# Patient Record
Sex: Female | Born: 1960 | Race: White | Hispanic: No | Marital: Married | Smoking: Former smoker
Health system: Southern US, Community
[De-identification: ages and names within clinical notes are randomized; demographics above are authoritative.]

## PROBLEM LIST (undated history)

## (undated) DIAGNOSIS — E039 Hypothyroidism, unspecified: Secondary | ICD-10-CM

## (undated) DIAGNOSIS — R011 Cardiac murmur, unspecified: Secondary | ICD-10-CM

## (undated) DIAGNOSIS — F419 Anxiety disorder, unspecified: Secondary | ICD-10-CM

## (undated) DIAGNOSIS — M1990 Unspecified osteoarthritis, unspecified site: Secondary | ICD-10-CM

## (undated) DIAGNOSIS — I10 Essential (primary) hypertension: Secondary | ICD-10-CM

## (undated) DIAGNOSIS — F32A Depression, unspecified: Secondary | ICD-10-CM

## (undated) HISTORY — PX: OTHER SURGICAL HISTORY: SHX169

## (undated) HISTORY — PX: ABDOMINAL HYSTERECTOMY: SHX81

## (undated) HISTORY — PX: COLONOSCOPY: SHX174

---

## 2001-05-21 ENCOUNTER — Encounter: Payer: Self-pay | Admitting: Family Medicine

## 2001-05-21 ENCOUNTER — Ambulatory Visit (HOSPITAL_COMMUNITY): Admission: RE | Admit: 2001-05-21 | Discharge: 2001-05-21 | Payer: Self-pay | Admitting: Family Medicine

## 2001-06-08 ENCOUNTER — Other Ambulatory Visit: Admission: RE | Admit: 2001-06-08 | Discharge: 2001-06-08 | Payer: Self-pay | Admitting: Obstetrics & Gynecology

## 2003-08-17 ENCOUNTER — Ambulatory Visit (HOSPITAL_COMMUNITY): Admission: RE | Admit: 2003-08-17 | Discharge: 2003-08-17 | Payer: Self-pay | Admitting: Obstetrics and Gynecology

## 2003-10-06 ENCOUNTER — Ambulatory Visit (HOSPITAL_COMMUNITY): Admission: RE | Admit: 2003-10-06 | Discharge: 2003-10-06 | Payer: Self-pay | Admitting: Internal Medicine

## 2003-12-07 ENCOUNTER — Ambulatory Visit (HOSPITAL_COMMUNITY): Admission: RE | Admit: 2003-12-07 | Discharge: 2003-12-07 | Payer: Self-pay | Admitting: Internal Medicine

## 2004-02-20 ENCOUNTER — Ambulatory Visit (HOSPITAL_COMMUNITY): Admission: RE | Admit: 2004-02-20 | Discharge: 2004-02-20 | Payer: Self-pay | Admitting: Internal Medicine

## 2005-01-23 ENCOUNTER — Ambulatory Visit (HOSPITAL_COMMUNITY): Admission: RE | Admit: 2005-01-23 | Discharge: 2005-01-23 | Payer: Self-pay | Admitting: General Surgery

## 2005-03-08 ENCOUNTER — Ambulatory Visit (HOSPITAL_COMMUNITY): Admission: RE | Admit: 2005-03-08 | Discharge: 2005-03-08 | Payer: Self-pay | Admitting: Internal Medicine

## 2006-01-03 ENCOUNTER — Ambulatory Visit (HOSPITAL_COMMUNITY): Admission: RE | Admit: 2006-01-03 | Discharge: 2006-01-03 | Payer: Self-pay | Admitting: Otolaryngology

## 2006-03-17 ENCOUNTER — Ambulatory Visit (HOSPITAL_COMMUNITY): Admission: RE | Admit: 2006-03-17 | Discharge: 2006-03-17 | Payer: Self-pay | Admitting: Internal Medicine

## 2006-04-04 ENCOUNTER — Encounter: Admission: RE | Admit: 2006-04-04 | Discharge: 2006-04-04 | Payer: Self-pay | Admitting: Internal Medicine

## 2007-03-19 ENCOUNTER — Encounter: Admission: RE | Admit: 2007-03-19 | Discharge: 2007-03-19 | Payer: Self-pay | Admitting: Family Medicine

## 2007-04-03 ENCOUNTER — Encounter: Admission: RE | Admit: 2007-04-03 | Discharge: 2007-04-03 | Payer: Self-pay | Admitting: Family Medicine

## 2008-01-29 HISTORY — PX: BREAST BIOPSY: SHX20

## 2008-04-04 ENCOUNTER — Encounter: Admission: RE | Admit: 2008-04-04 | Discharge: 2008-04-04 | Payer: Self-pay | Admitting: General Practice

## 2009-04-04 ENCOUNTER — Ambulatory Visit (HOSPITAL_COMMUNITY): Admission: RE | Admit: 2009-04-04 | Discharge: 2009-04-04 | Payer: Self-pay | Admitting: General Surgery

## 2009-04-17 ENCOUNTER — Encounter: Admission: RE | Admit: 2009-04-17 | Discharge: 2009-04-17 | Payer: Self-pay | Admitting: General Practice

## 2009-04-20 ENCOUNTER — Encounter: Admission: RE | Admit: 2009-04-20 | Discharge: 2009-04-20 | Payer: Self-pay | Admitting: General Practice

## 2010-02-18 ENCOUNTER — Encounter: Payer: Self-pay | Admitting: Internal Medicine

## 2010-02-18 ENCOUNTER — Encounter: Payer: Self-pay | Admitting: General Practice

## 2010-04-02 ENCOUNTER — Other Ambulatory Visit: Payer: Self-pay | Admitting: General Practice

## 2010-04-02 DIAGNOSIS — Z1231 Encounter for screening mammogram for malignant neoplasm of breast: Secondary | ICD-10-CM

## 2010-04-19 ENCOUNTER — Ambulatory Visit
Admission: RE | Admit: 2010-04-19 | Discharge: 2010-04-19 | Disposition: A | Discharge status: Home / Self Care | Payer: Self-pay | Source: Ambulatory Visit | Attending: General Practice | Admitting: General Practice

## 2010-04-19 DIAGNOSIS — Z1231 Encounter for screening mammogram for malignant neoplasm of breast: Secondary | ICD-10-CM

## 2010-06-15 NOTE — H&P (Signed)
NAME:  Carla Ali, Carla Ali             ACCOUNT NO.:  [id number]   MEDICAL RECORD NO.:  [id number]          PATIENT TYPE:  AMB   LOCATION:  DAY                           FACILITY:  [facility]   PHYSICIAN:  Dalia Heading, M.D.  DATE OF BIRTH:  05/13/1960   DATE OF ADMISSION:  DATE OF DISCHARGE:  LH                                HISTORY & PHYSICAL   CHIEF COMPLAINT:  Family history of colon carcinoma, GERD.   HISTORY OF PRESENT ILLNESS:  The patient is a 50 year old white female who  is referred for endoscopic evaluation. She needs a colonoscopy for family  history of colon carcinoma and EGD for GERD. Her mother was recently  diagnosed with colon cancer and another relative has multiple colonic  polyps. She was also recently treated for a Helicobacter pylori infection.  She has a sore throat and chronic cough with some laryngitis present. She  does feel an acid taste in her mouth. No weight loss, nausea, vomiting,  diarrhea, constipation, melena, or hematochezia have been noted. She has  never had a colonoscopy.   PAST MEDICAL HISTORY:  As noted above.   PAST SURGICAL HISTORY:  Unremarkable.   CURRENT MEDICATIONS:  Prevacid, Percocet.   ALLERGIES:  No known drug allergies.   REVIEW OF SYSTEMS:  Noncontributory.   PHYSICAL EXAMINATION:  GENERAL:  The patient is a well-developed, well-  nourished, white female in no acute distress. She is afebrile and vital  signs are stable.  LUNGS:  Clear to auscultation with equal breath sounds bilaterally.  HEART:  Reveals a regular rate and rhythm without S3, S4, or murmurs.  ABDOMEN:  Soft, nontender, nondistended. No hepatosplenomegaly or masses are  noted.  RECTAL:  Deferred to the procedure.   IMPRESSION:  Family history of colon carcinoma, gastroesophageal reflux  disease.   PLAN:  The patient is scheduled for an EGD and colonoscopy on January 23, 2005. The risks and benefits of the procedure including bleeding and  perforation were  fully explained to the patient who gave informed consent.      Dalia Heading, M.D.  Electronically Signed     MAJ/MEDQ  D:  01/10/2005  T:  01/10/2005  Job:  [id number]   cc:   [facility]  Fax: [phone]   Madelin Rear. Sherwood Gambler, MD  Fax: [phone]

## 2011-04-09 ENCOUNTER — Other Ambulatory Visit: Payer: Self-pay | Admitting: Family Medicine

## 2011-04-09 ENCOUNTER — Other Ambulatory Visit: Payer: Self-pay | Admitting: General Practice

## 2011-04-09 DIAGNOSIS — Z1231 Encounter for screening mammogram for malignant neoplasm of breast: Secondary | ICD-10-CM

## 2011-04-24 ENCOUNTER — Ambulatory Visit
Admission: RE | Admit: 2011-04-24 | Discharge: 2011-04-24 | Disposition: A | Discharge status: Home / Self Care | Payer: 59 | Source: Ambulatory Visit | Attending: General Practice | Admitting: General Practice

## 2011-04-24 DIAGNOSIS — Z1231 Encounter for screening mammogram for malignant neoplasm of breast: Secondary | ICD-10-CM

## 2012-04-21 ENCOUNTER — Other Ambulatory Visit: Payer: Self-pay

## 2012-04-21 DIAGNOSIS — Z1231 Encounter for screening mammogram for malignant neoplasm of breast: Secondary | ICD-10-CM

## 2012-05-12 ENCOUNTER — Ambulatory Visit
Admission: RE | Admit: 2012-05-12 | Discharge: 2012-05-12 | Disposition: A | Discharge status: Home / Self Care | Payer: 59 | Source: Ambulatory Visit

## 2012-05-12 DIAGNOSIS — Z1231 Encounter for screening mammogram for malignant neoplasm of breast: Secondary | ICD-10-CM

## 2013-05-14 ENCOUNTER — Other Ambulatory Visit: Payer: Self-pay

## 2013-05-14 DIAGNOSIS — Z1231 Encounter for screening mammogram for malignant neoplasm of breast: Secondary | ICD-10-CM

## 2013-05-24 ENCOUNTER — Encounter (INDEPENDENT_AMBULATORY_CARE_PROVIDER_SITE_OTHER): Payer: Self-pay

## 2013-05-24 ENCOUNTER — Ambulatory Visit
Admission: RE | Admit: 2013-05-24 | Discharge: 2013-05-24 | Disposition: A | Discharge status: Home / Self Care | Payer: Self-pay | Source: Ambulatory Visit

## 2013-05-24 ENCOUNTER — Ambulatory Visit: Payer: 59

## 2013-05-24 DIAGNOSIS — Z1231 Encounter for screening mammogram for malignant neoplasm of breast: Secondary | ICD-10-CM

## 2014-06-10 ENCOUNTER — Other Ambulatory Visit: Payer: Self-pay

## 2014-06-10 DIAGNOSIS — Z1231 Encounter for screening mammogram for malignant neoplasm of breast: Secondary | ICD-10-CM

## 2014-06-17 ENCOUNTER — Ambulatory Visit: Payer: Self-pay

## 2014-07-21 ENCOUNTER — Ambulatory Visit: Payer: Self-pay

## 2014-07-25 ENCOUNTER — Ambulatory Visit
Admission: RE | Admit: 2014-07-25 | Discharge: 2014-07-25 | Disposition: A | Discharge status: Home / Self Care | Payer: 59 | Source: Ambulatory Visit

## 2014-07-25 DIAGNOSIS — Z1231 Encounter for screening mammogram for malignant neoplasm of breast: Secondary | ICD-10-CM

## 2015-06-20 ENCOUNTER — Other Ambulatory Visit: Payer: Self-pay

## 2015-06-20 DIAGNOSIS — Z1231 Encounter for screening mammogram for malignant neoplasm of breast: Secondary | ICD-10-CM

## 2015-07-27 ENCOUNTER — Ambulatory Visit
Admission: RE | Admit: 2015-07-27 | Discharge: 2015-07-27 | Disposition: A | Discharge status: Home / Self Care | Payer: 59 | Source: Ambulatory Visit

## 2015-07-27 DIAGNOSIS — Z1231 Encounter for screening mammogram for malignant neoplasm of breast: Secondary | ICD-10-CM

## 2016-09-05 ENCOUNTER — Other Ambulatory Visit: Payer: Self-pay | Admitting: Nurse Practitioner

## 2016-09-05 DIAGNOSIS — Z1231 Encounter for screening mammogram for malignant neoplasm of breast: Secondary | ICD-10-CM

## 2016-09-17 ENCOUNTER — Ambulatory Visit
Admission: RE | Admit: 2016-09-17 | Discharge: 2016-09-17 | Disposition: A | Discharge status: Home / Self Care | Payer: 59 | Source: Ambulatory Visit | Attending: Nurse Practitioner | Admitting: Nurse Practitioner

## 2016-09-17 DIAGNOSIS — Z1231 Encounter for screening mammogram for malignant neoplasm of breast: Secondary | ICD-10-CM

## 2017-10-02 ENCOUNTER — Other Ambulatory Visit: Payer: Self-pay | Admitting: Family Medicine

## 2017-10-02 DIAGNOSIS — Z1231 Encounter for screening mammogram for malignant neoplasm of breast: Secondary | ICD-10-CM

## 2017-10-23 ENCOUNTER — Encounter: Payer: Self-pay | Admitting: General Surgery

## 2017-10-23 ENCOUNTER — Ambulatory Visit (INDEPENDENT_AMBULATORY_CARE_PROVIDER_SITE_OTHER): Payer: 59 | Admitting: General Surgery

## 2017-10-23 ENCOUNTER — Ambulatory Visit
Admission: RE | Admit: 2017-10-23 | Discharge: 2017-10-23 | Disposition: A | Discharge status: Home / Self Care | Payer: 59 | Source: Ambulatory Visit | Attending: Family Medicine | Admitting: Family Medicine

## 2017-10-23 VITALS — BP 159/84 | HR 61 | Temp 98.8°F | Ht 64.0 in | Wt 186.0 lb

## 2017-10-23 DIAGNOSIS — Z1211 Encounter for screening for malignant neoplasm of colon: Secondary | ICD-10-CM | POA: Diagnosis not present

## 2017-10-23 DIAGNOSIS — Z1231 Encounter for screening mammogram for malignant neoplasm of breast: Secondary | ICD-10-CM

## 2017-10-23 NOTE — Progress Notes (Signed)
Carla Ali; [id number]; 01/20/1961   HPI Patient is a 57-year-old white female who was referred to my care by Michael Waters for a screening colonoscopy.  Patient last had a colonoscopy 8 years ago.  She denies any nausea, vomiting, abdominal pain, blood per rectum, abnormal diarrhea or constipation.  She does have an immediate family member who had colon cancer (mother).  Patient has 0 out of 10 abdominal pain. History reviewed. No pertinent past medical history.  Past Surgical History:  Procedure Laterality Date  . BREAST BIOPSY Right 2010   benign    Family History  Problem Relation Age of Onset  . Breast cancer Paternal Aunt     No current outpatient medications on file prior to visit.   No current facility-administered medications on file prior to visit.     Not on File  Social History   Substance and Sexual Activity  Alcohol Use Not on file    Social History   Tobacco Use  Smoking Status Never Smoker  Smokeless Tobacco Never Used    Review of Systems  Constitutional: Negative.   HENT: Negative.   Eyes: Negative.   Respiratory: Negative.   Cardiovascular: Negative.   Gastrointestinal: Negative.   Genitourinary: Negative.   Musculoskeletal: Positive for back pain, joint pain and neck pain.  Skin: Negative.   Neurological: Negative.   Endo/Heme/Allergies: Negative.   Psychiatric/Behavioral: Negative.     Objective   Vitals:   10/23/17 0917  BP: (!) 159/84  Pulse: 61  Temp: 98.8 F (37.1 C)    Physical Exam  Constitutional: She is oriented to person, place, and time. She appears well-developed and well-nourished. No distress.  HENT:  Head: Normocephalic and atraumatic.  Cardiovascular: Normal rate, regular rhythm and normal heart sounds. Exam reveals no gallop and no friction rub.  No murmur heard. Pulmonary/Chest: Effort normal and breath sounds normal. No stridor. No respiratory distress. She has no wheezes. She has no rales.  Abdominal:  Soft. Bowel sounds are normal. She exhibits no distension and no mass. There is no tenderness. There is no rebound and no guarding.  Neurological: She is alert and oriented to person, place, and time.  Skin: Skin is warm and dry.  Vitals reviewed.   Assessment  Need for screening colonoscopy, family history of colon cancer Plan   Patient is scheduled for screening colonoscopy on 11/18/2017.  The risks and benefits of the procedure including bleeding and perforation were fully explained to the patient, who gave informed consent.  Golytely prescribed.  

## 2017-10-23 NOTE — H&P (Signed)
Carla Ali; [id number]; 02-Sep-1960   HPI Patient is a 57 year old white female who was referred to my care by Adonis Huguenin for a screening colonoscopy.  Patient last had a colonoscopy 8 years ago.  She denies any nausea, vomiting, abdominal pain, blood per rectum, abnormal diarrhea or constipation.  She does have an immediate family member who had colon cancer (mother).  Patient has 0 out of 10 abdominal pain. History reviewed. No pertinent past medical history.  Past Surgical History:  Procedure Laterality Date  . BREAST BIOPSY Right 2010   benign    Family History  Problem Relation Age of Onset  . Breast cancer Paternal Aunt     No current outpatient medications on file prior to visit.   No current facility-administered medications on file prior to visit.     Not on File  Social History   Substance and Sexual Activity  Alcohol Use Not on file    Social History   Tobacco Use  Smoking Status Never Smoker  Smokeless Tobacco Never Used    Review of Systems  Constitutional: Negative.   HENT: Negative.   Eyes: Negative.   Respiratory: Negative.   Cardiovascular: Negative.   Gastrointestinal: Negative.   Genitourinary: Negative.   Musculoskeletal: Positive for back pain, joint pain and neck pain.  Skin: Negative.   Neurological: Negative.   Endo/Heme/Allergies: Negative.   Psychiatric/Behavioral: Negative.     Objective   Vitals:   10/23/17 0917  BP: (!) 159/84  Pulse: 61  Temp: 98.8 F (37.1 C)    Physical Exam  Constitutional: She is oriented to person, place, and time. She appears well-developed and well-nourished. No distress.  HENT:  Head: Normocephalic and atraumatic.  Cardiovascular: Normal rate, regular rhythm and normal heart sounds. Exam reveals no gallop and no friction rub.  No murmur heard. Pulmonary/Chest: Effort normal and breath sounds normal. No stridor. No respiratory distress. She has no wheezes. She has no rales.  Abdominal:  Soft. Bowel sounds are normal. She exhibits no distension and no mass. There is no tenderness. There is no rebound and no guarding.  Neurological: She is alert and oriented to person, place, and time.  Skin: Skin is warm and dry.  Vitals reviewed.   Assessment  Need for screening colonoscopy, family history of colon cancer Plan   Patient is scheduled for screening colonoscopy on 11/18/2017.  The risks and benefits of the procedure including bleeding and perforation were fully explained to the patient, who gave informed consent.  Golytely prescribed.

## 2017-10-23 NOTE — Patient Instructions (Signed)
Colonoscopy, Adult A colonoscopy is an exam to look at the entire large intestine. During the exam, a lubricated, bendable tube is inserted into the anus and then passed into the rectum, colon, and other parts of the large intestine. A colonoscopy is often done as a part of normal colorectal screening or in response to certain symptoms, such as anemia, persistent diarrhea, abdominal pain, and blood in the stool. The exam can help screen for and diagnose medical problems, including:  Tumors.  Polyps.  Inflammation.  Areas of bleeding.  Tell a health care provider about:  Any allergies you have.  All medicines you are taking, including vitamins, herbs, eye drops, creams, and over-the-counter medicines.  Any problems you or family members have had with anesthetic medicines.  Any blood disorders you have.  Any surgeries you have had.  Any medical conditions you have.  Any problems you have had passing stool. What are the risks? Generally, this is a safe procedure. However, problems may occur, including:  Bleeding.  A tear in the intestine.  A reaction to medicines given during the exam.  Infection (rare).  What happens before the procedure? Eating and drinking restrictions Follow instructions from your health care provider about eating and drinking, which may include:  A few days before the procedure - follow a low-fiber diet. Avoid nuts, seeds, dried fruit, raw fruits, and vegetables.  1-3 days before the procedure - follow a clear liquid diet. Drink only clear liquids, such as clear broth or bouillon, black coffee or tea, clear juice, clear soft drinks or sports drinks, gelatin dessert, and popsicles. Avoid any liquids that contain red or purple dye.  On the day of the procedure - do not eat or drink anything during the 2 hours before the procedure, or within the time period that your health care provider recommends.  Bowel prep If you were prescribed an oral bowel prep  to clean out your colon:  Take it as told by your health care provider. Starting the day before your procedure, you will need to drink a large amount of medicated liquid. The liquid will cause you to have multiple loose stools until your stool is almost clear or light green.  If your skin or anus gets irritated from diarrhea, you may use these to relieve the irritation: ? Medicated wipes, such as adult wet wipes with aloe and vitamin E. ? A skin soothing-product like petroleum jelly.  If you vomit while drinking the bowel prep, take a break for up to 60 minutes and then begin the bowel prep again. If vomiting continues and you cannot take the bowel prep without vomiting, call your health care provider.  General instructions  Ask your health care provider about changing or stopping your regular medicines. This is especially important if you are taking diabetes medicines or blood thinners.  Plan to have someone take you home from the hospital or clinic. What happens during the procedure?  An IV tube may be inserted into one of your veins.  You will be given medicine to help you relax (sedative).  To reduce your risk of infection: ? Your health care team will wash or sanitize their hands. ? Your anal area will be washed with soap.  You will be asked to lie on your side with your knees bent.  Your health care provider will lubricate a long, thin, flexible tube. The tube will have a camera and a light on the end.  The tube will be inserted into your   anus.  The tube will be gently eased through your rectum and colon.  Air will be delivered into your colon to keep it open. You may feel some pressure or cramping.  The camera will be used to take images during the procedure.  A small tissue sample may be removed from your body to be examined under a microscope (biopsy). If any potential problems are found, the tissue will be sent to a lab for testing.  If small polyps are found, your  health care provider may remove them and have them checked for cancer cells.  The tube that was inserted into your anus will be slowly removed. The procedure may vary among health care providers and hospitals. What happens after the procedure?  Your blood pressure, heart rate, breathing rate, and blood oxygen level will be monitored until the medicines you were given have worn off.  Do not drive for 24 hours after the exam.  You may have a small amount of blood in your stool.  You may pass gas and have mild abdominal cramping or bloating due to the air that was used to inflate your colon during the exam.  It is up to you to get the results of your procedure. Ask your health care provider, or the department performing the procedure, when your results will be ready. This information is not intended to replace advice given to you by your health care provider. Make sure you discuss any questions you have with your health care provider. Document Released: 01/12/2000 Document Revised: 11/15/2015 Document Reviewed: 03/28/2015 Elsevier Interactive Patient Education  2018 Elsevier Inc.  

## 2017-11-03 ENCOUNTER — Ambulatory Visit: Payer: 59

## 2017-11-18 ENCOUNTER — Ambulatory Visit (HOSPITAL_COMMUNITY)
Admission: RE | Admit: 2017-11-18 | Discharge: 2017-11-18 | Disposition: A | Discharge status: Home / Self Care | Payer: 59 | Source: Ambulatory Visit | Attending: General Surgery | Admitting: General Surgery

## 2017-11-18 ENCOUNTER — Encounter (HOSPITAL_COMMUNITY): Payer: Self-pay | Admitting: *Deleted

## 2017-11-18 ENCOUNTER — Encounter (HOSPITAL_COMMUNITY)
Admission: RE | Disposition: A | Discharge status: Home / Self Care | Payer: Self-pay | Source: Ambulatory Visit | Attending: General Surgery

## 2017-11-18 ENCOUNTER — Other Ambulatory Visit: Payer: Self-pay

## 2017-11-18 DIAGNOSIS — Z800 Family history of malignant neoplasm of digestive organs: Secondary | ICD-10-CM | POA: Diagnosis not present

## 2017-11-18 DIAGNOSIS — Z1211 Encounter for screening for malignant neoplasm of colon: Secondary | ICD-10-CM | POA: Diagnosis present

## 2017-11-18 HISTORY — DX: Essential (primary) hypertension: I10

## 2017-11-18 HISTORY — DX: Hypothyroidism, unspecified: E03.9

## 2017-11-18 HISTORY — PX: COLONOSCOPY: SHX5424

## 2017-11-18 SURGERY — COLONOSCOPY
Anesthesia: Moderate Sedation

## 2017-11-18 MED ORDER — MIDAZOLAM HCL 5 MG/5ML IJ SOLN
INTRAMUSCULAR | Status: AC
  Filled 2017-11-18: qty 5

## 2017-11-18 MED ORDER — MEPERIDINE HCL 100 MG/ML IJ SOLN
INTRAMUSCULAR | Status: AC
  Filled 2017-11-18: qty 1

## 2017-11-18 MED ORDER — SODIUM CHLORIDE 0.9 % IV SOLN
INTRAVENOUS | Status: DC
  Administered 2017-11-18: 07:00:00 via INTRAVENOUS

## 2017-11-18 MED ORDER — MIDAZOLAM HCL 5 MG/5ML IJ SOLN
INTRAMUSCULAR | Status: DC | PRN
  Administered 2017-11-18: 1 mg via INTRAVENOUS
  Administered 2017-11-18: 3 mg via INTRAVENOUS

## 2017-11-18 MED ORDER — MEPERIDINE HCL 50 MG/ML IJ SOLN
INTRAMUSCULAR | Status: DC | PRN
  Administered 2017-11-18: 50 mg
  Administered 2017-11-18: 25 mg

## 2017-11-18 NOTE — Interval H&P Note (Signed)
History and Physical Interval Note:  11/18/2017 7:26 AM  Carla Ali  has presented today for surgery, with the diagnosis of screening  The various methods of treatment have been discussed with the patient and family. After consideration of risks, benefits and other options for treatment, the patient has consented to  Procedure(s): COLONOSCOPY (N/A) as a surgical intervention .  The patient's history has been reviewed, patient examined, no change in status, stable for surgery.  I have reviewed the patient's chart and labs.  Questions were answered to the patient's satisfaction.     Franky Macho

## 2017-11-18 NOTE — Discharge Instructions (Signed)
Colonoscopy, Adult, Care After  This sheet gives you information about how to care for yourself after your procedure. Your health care provider may also give you more specific instructions. If you have problems or questions, contact your health care provider.  What can I expect after the procedure?  After the procedure, it is common to have:  · A small amount of blood in your stool for 24 hours after the procedure.  · Some gas.  · Mild abdominal cramping or bloating.    Follow these instructions at home:  General instructions    · For the first 24 hours after the procedure:  ? Do not drive or use machinery.  ? Do not sign important documents.  ? Do not drink alcohol.  ? Do your regular daily activities at a slower pace than normal.  ? Eat soft, easy-to-digest foods.  ? Rest often.  · Take over-the-counter or prescription medicines only as told by your health care provider.  · It is up to you to get the results of your procedure. Ask your health care provider, or the department performing the procedure, when your results will be ready.  Relieving cramping and bloating  · Try walking around when you have cramps or feel bloated.  · Apply heat to your abdomen as told by your health care provider. Use a heat source that your health care provider recommends, such as a moist heat pack or a heating pad.  ? Place a towel between your skin and the heat source.  ? Leave the heat on for 20-30 minutes.  ? Remove the heat if your skin turns bright red. This is especially important if you are unable to feel pain, heat, or cold. You may have a greater risk of getting burned.  Eating and drinking  · Drink enough fluid to keep your urine clear or pale yellow.  · Resume your normal diet as instructed by your health care provider. Avoid heavy or fried foods that are hard to digest.  · Avoid drinking alcohol for as long as instructed by your health care provider.  Contact a health care provider if:  · You have blood in your stool 2-3  days after the procedure.  Get help right away if:  · You have more than a small spotting of blood in your stool.  · You pass large blood clots in your stool.  · Your abdomen is swollen.  · You have nausea or vomiting.  · You have a fever.  · You have increasing abdominal pain that is not relieved with medicine.  This information is not intended to replace advice given to you by your health care provider. Make sure you discuss any questions you have with your health care provider.  Document Released: 08/29/2003 Document Revised: 10/09/2015 Document Reviewed: 03/28/2015  Elsevier Interactive Patient Education © 2018 Elsevier Inc.

## 2017-11-18 NOTE — Op Note (Signed)
[facility] Patient Name: Carla Ali Procedure Date: 11/18/2017 7:04 AM MRN: [id number] Date of Birth: 1960-07-22 Attending MD: Franky Macho , MD CSN: [id number] Age: 57 Admit Type: Outpatient Procedure:                Colonoscopy Indications:              Screening in patient at increased risk: Family                            history of 1st-degree relative with colorectal                            cancer Providers:                Franky Macho, MD, Jannett Celestine, RN, Dyann Ruddle Referring MD:              Medicines:                Midazolam 4 mg IV, Meperidine 75 mg IV Complications:            No immediate complications. Estimated blood loss:                            None. Estimated Blood Loss:     Estimated blood loss: none. Procedure:                Pre-Anesthesia Assessment:                           - Prior to the procedure, a History and Physical                            was performed, and patient medications and                            allergies were reviewed. The patient is competent.                            The risks and benefits of the procedure and the                            sedation options and risks were discussed with the                            patient. All questions were answered and informed                            consent was obtained. Patient identification and                            proposed procedure were verified by the physician,                            the nurse and the technician in the endoscopy  suite. Mental Status Examination: alert and                            oriented. Airway Examination: normal oropharyngeal                            airway and neck mobility. Respiratory Examination:                            clear to auscultation. CV Examination: normal.                            Prophylactic Antibiotics: The patient does not                            require prophylactic  antibiotics. Prior                            Anticoagulants: The patient has taken no previous                            anticoagulant or antiplatelet agents. ASA Grade                            Assessment: II - A patient with mild systemic                            disease. After reviewing the risks and benefits,                            the patient was deemed in satisfactory condition to                            undergo the procedure. The anesthesia plan was to                            use moderate sedation / analgesia (conscious                            sedation). Immediately prior to administration of                            medications, the patient was re-assessed for                            adequacy to receive sedatives. The heart rate,                            respiratory rate, oxygen saturations, blood                            pressure, adequacy of pulmonary ventilation, and  response to care were monitored throughout the                            procedure. The physical status of the patient was                            re-assessed after the procedure.                           After obtaining informed consent, the colonoscope                            was passed under direct vision. Throughout the                            procedure, the patient's blood pressure, pulse, and                            oxygen saturations were monitored continuously. The                            [id number]-[id number] ([id number]) scope was introduced through                            the anus and advanced to the the cecum, identified                            by the appendiceal orifice, ileocecal valve and                            palpation. The colonoscopy was performed without                            difficulty. No anatomical landmarks were                            photographed. The entire colon was well visualized.                            The  quality of the bowel preparation was adequate.                            The total duration of the procedure was 20 minutes. Scope In: 7:30:35 AM Scope Out: 7:48:53 AM Scope Withdrawal Time: 0 hours 3 minutes 45 seconds  Total Procedure Duration: 0 hours 18 minutes 18 seconds  Findings:      The perianal and digital rectal examinations were normal.      The entire examined colon appeared normal on direct and retroflexion       views. Impression:               - The entire examined colon is normal on direct and                            retroflexion views.                           -  No specimens collected. Moderate Sedation:      Moderate (conscious) sedation was administered by the endoscopy nurse       and supervised by the endoscopist. The following parameters were       monitored: oxygen saturation, heart rate, blood pressure, and response       to care. Recommendation:           - Written discharge instructions were provided to                            the patient.                           - The signs and symptoms of potential delayed                            complications were discussed with the patient.                           - Patient has a contact number available for                            emergencies.                           - Return to normal activities tomorrow.                           - Resume previous diet.                           - Continue present medications.                           - Repeat colonoscopy in 5 years for screening                            purposes. Procedure Code(s):        --- Professional ---                           [phone], Colonoscopy, flexible; diagnostic, including                            collection of specimen(s) by brushing or washing,                            when performed (separate procedure) Diagnosis Code(s):        --- Professional ---                           Z80.0, Family history of malignant neoplasm of                             digestive organs CPT copyright 2018 American [employer] Association. All rights reserved. The codes documented in this report are preliminary and upon coder review may  be revised to meet current compliance requirements.  Franky Macho, MD Franky Macho, MD 11/18/2017 7:54:16 AM This report has been signed electronically. Number of Addenda: 0

## 2017-11-24 ENCOUNTER — Encounter (HOSPITAL_COMMUNITY): Payer: Self-pay | Admitting: General Surgery

## 2019-10-23 IMAGING — MG DIGITAL SCREENING BILATERAL MAMMOGRAM WITH TOMO AND CAD
8 series · 8 of 24 positions shown · non-contrast
Comparison: Previous exam(s).

CLINICAL DATA: Screening.

EXAM:
DIGITAL SCREENING BILATERAL MAMMOGRAM WITH TOMO AND CAD

[L CC synth-2D]
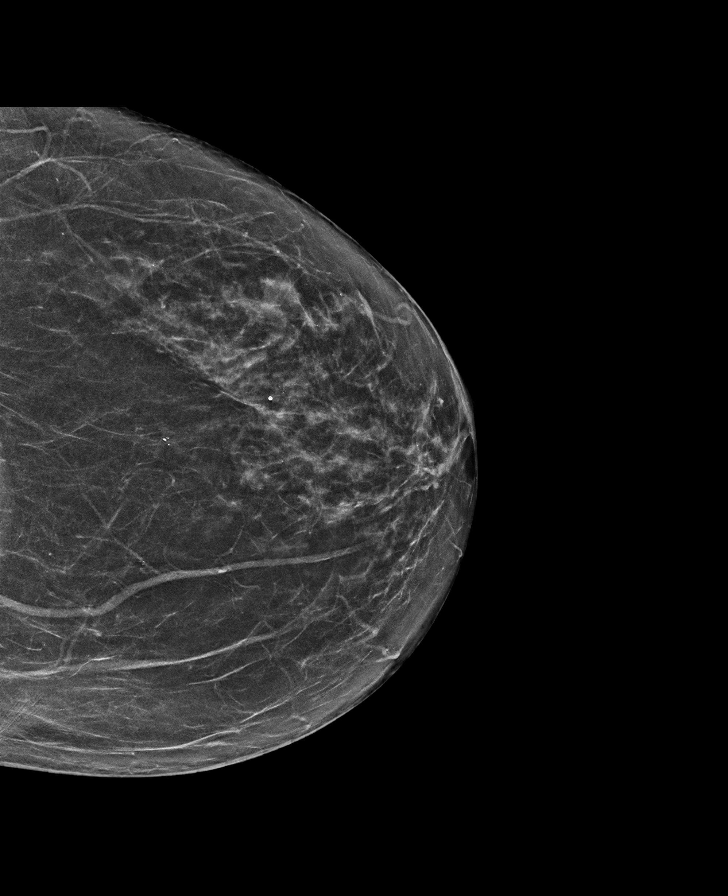

[R MLO synth-2D]
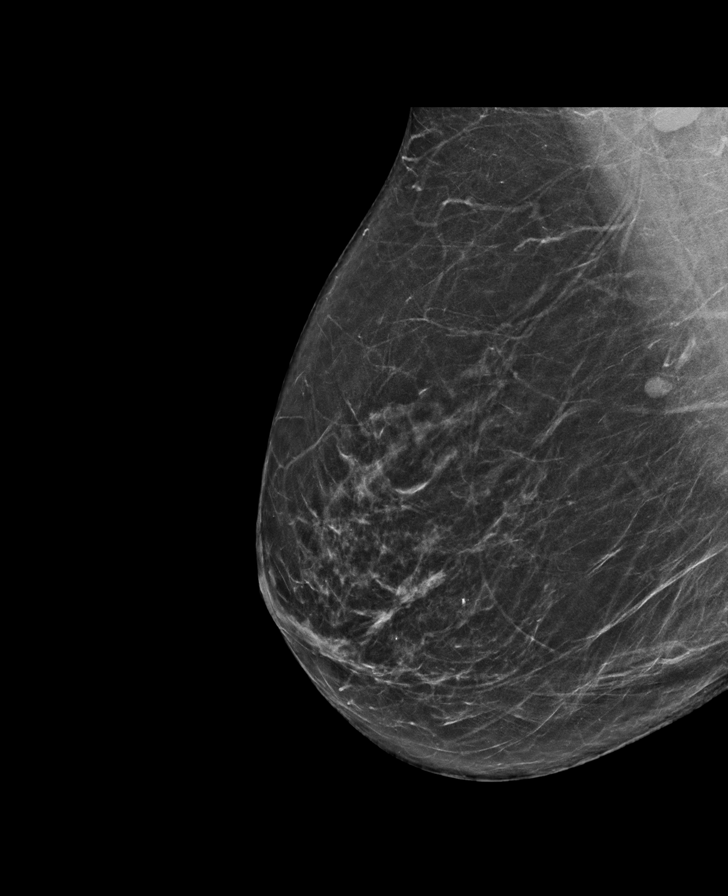

[L MLO synth-2D]
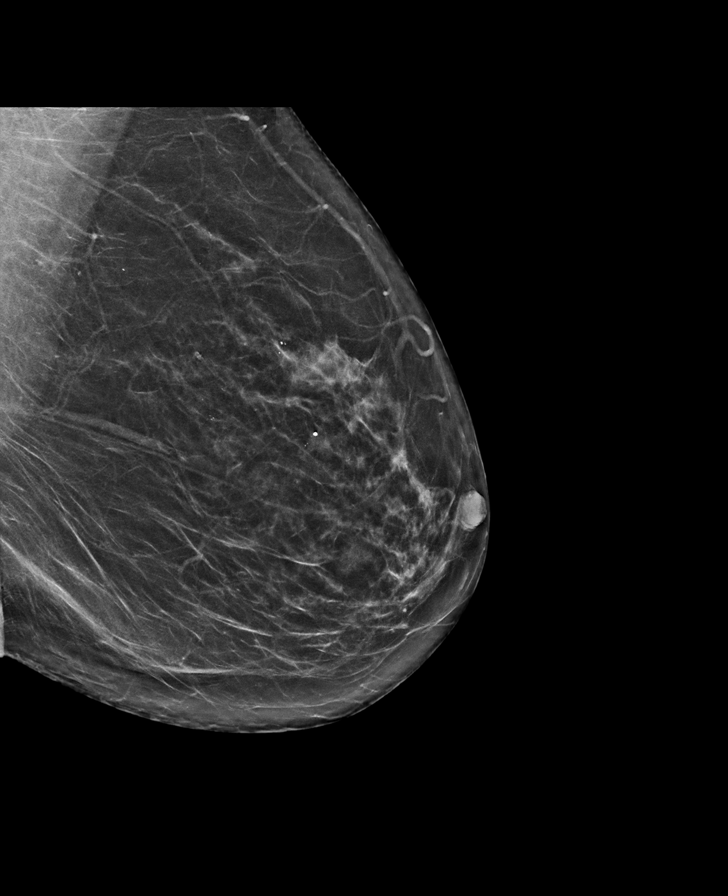

[R CC synth-2D]
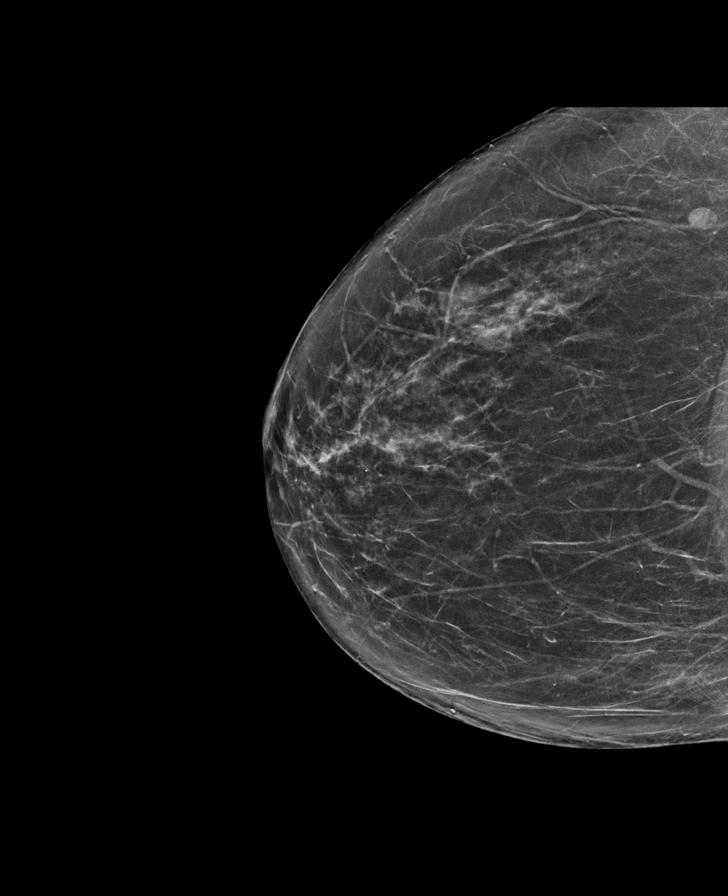

[R MLO tomo · tomo slice 35/69.0]
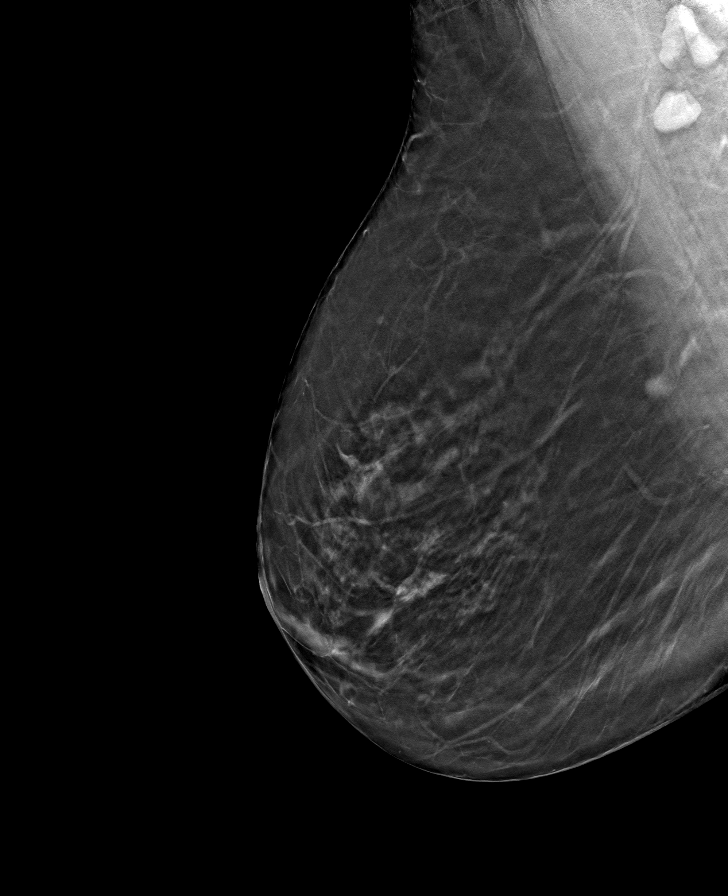

[L CC tomo · tomo slice 33/64.0]
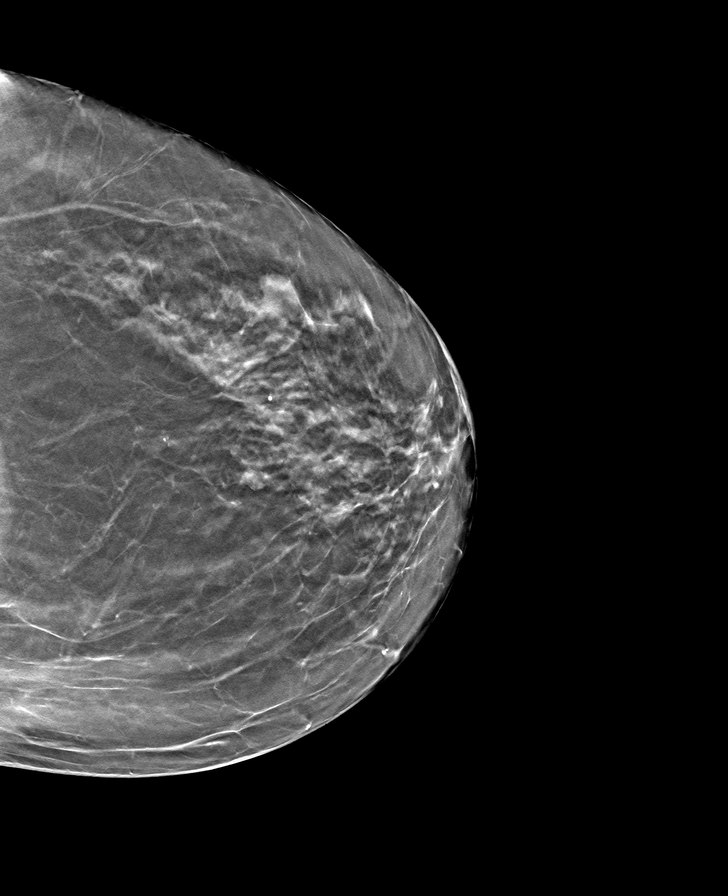

[L MLO tomo · tomo slice 37/72.0]
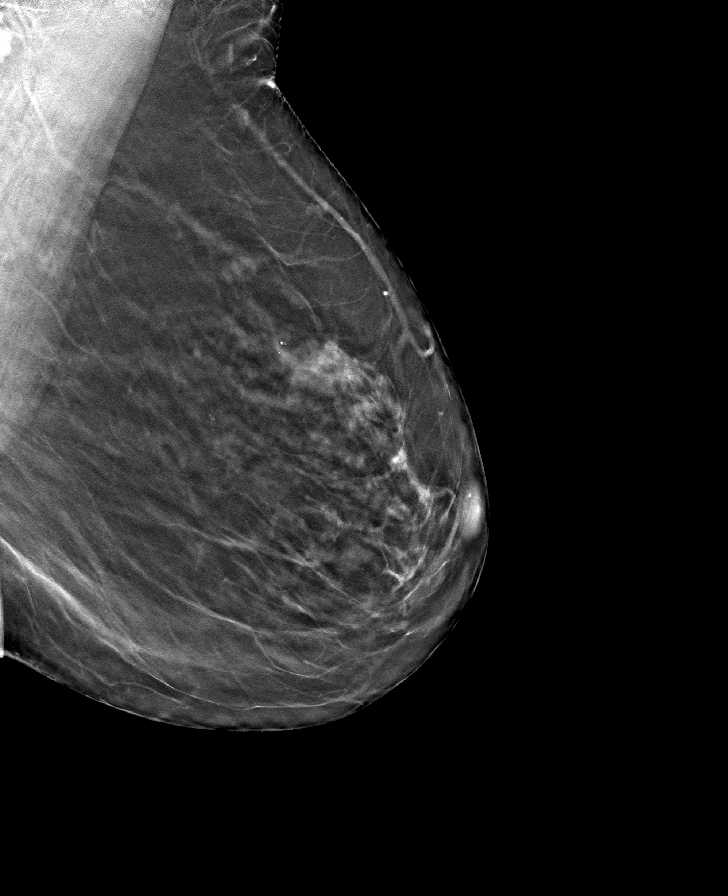

[R CC tomo · tomo slice 34/67.0]
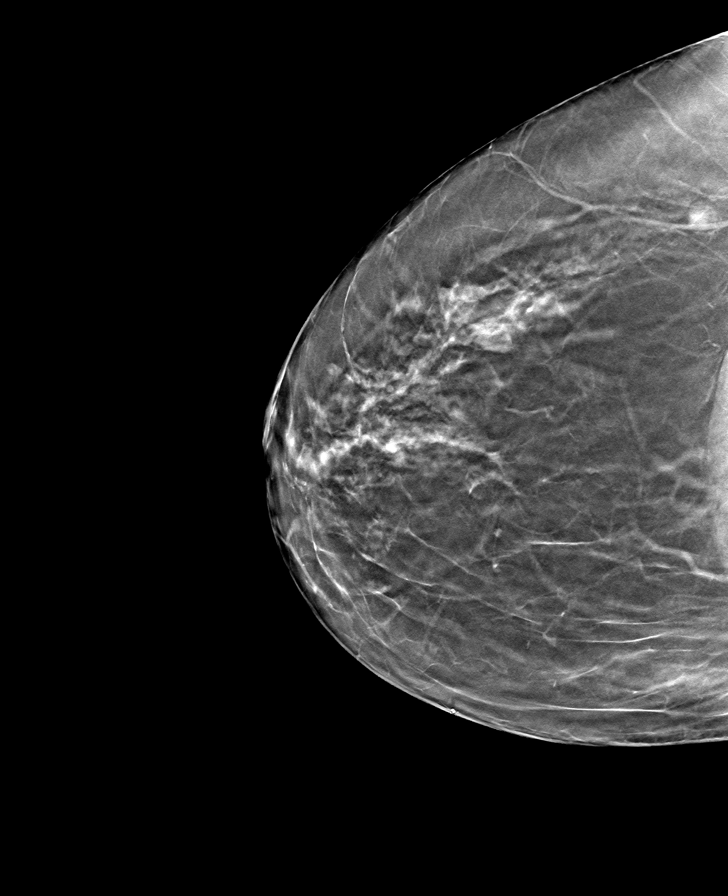

[8 of 24 positions shown; findings below may reference images not displayed]

ACR Breast Density Category b: There are scattered areas of
fibroglandular density.
FINDINGS: There are no findings suspicious for malignancy. Images were
processed with CAD.
IMPRESSION: No mammographic evidence of malignancy. A result letter of this
screening mammogram will be mailed directly to the patient.

RECOMMENDATION:
Screening mammogram in one year. (Code:CN-U-775)

BI-RADS CATEGORY  1: Negative.

## 2019-12-16 NOTE — Patient Instructions (Addendum)
DUE TO COVID-19 ONLY ONE VISITOR IS ALLOWED TO COME WITH YOU AND STAY IN THE WAITING ROOM ONLY DURING PRE OP AND PROCEDURE DAY OF SURGERY. THE 1 VISITOR  MAY VISIT WITH YOU AFTER SURGERY IN YOUR PRIVATE ROOM DURING VISITING HOURS ONLY!  YOU NEED TO HAVE A COVID 19 TEST ON__11/22_____ @__2 :45_____, THIS TEST MUST BE DONE BEFORE SURGERY,  COVID TESTING SITE [address] [place] , IT IS ON THE RIGHT GOING OUT [address] APPROXIMATELY  2 MINUTES PAST [employer] ON THE RIGHT. ONCE YOUR COVID TEST IS COMPLETED,  PLEASE BEGIN THE QUARANTINE INSTRUCTIONS AS OUTLINED IN YOUR HANDOUT.                Carla Ali    Your procedure is scheduled on: 12/22/19   Report to [facility] Main  Entrance   Report to admitting at  8:40 AM     Call this number if you have problems the morning of surgery [phone]   . BRUSH YOUR TEETH MORNING OF SURGERY AND RINSE YOUR MOUTH OUT, NO CHEWING GUM CANDY OR MINTS.   No food after midnight.    You may have clear liquid until 8:00  AM.    At 8:00  AM drink pre surgery drink.   Nothing by mouth after 8:00 AM.   Take these medicines the morning of surgery with A SIP OF WATER: Citalopram, Levothyroxine                                 You may not have any metal on your body including hair pins and              piercings  Do not wear jewelry, make-up, lotions, powders or perfumes, deodorant             Do not wear nail polish on your fingernails.  Do not shave  48 hours prior to surgery.               Do not bring valuables to the hospital. [facility] IS NOT             RESPONSIBLE   FOR VALUABLES.  Contacts, dentures or bridgework may not be worn into surgery.       Patients discharged the day of surgery will not be allowed to drive home.   IF YOU ARE HAVING SURGERY AND GOING HOME THE SAME DAY, YOU MUST HAVE AN ADULT TO DRIVE YOU HOME AND BE WITH YOU FOR 24 HOURS  . YOU MAY GO HOME BY TAXI OR UBER OR  ORTHERWISE, BUT AN ADULT MUST ACCOMPANY YOU HOME AND STAY WITH YOU FOR 24 HOURS.  Name and phone number of your driver:  Special Instructions: N/A              Please read over the following fact sheets you were given: _____________________________________________________________________             [facility] - Preparing for Surgery Before surgery, you can play an important role.   Because skin is not sterile, your skin needs to be as free of germs as possible.   You can reduce the number of germs on your skin by washing with CHG (chlorahexidine gluconate) soap before surgery.   CHG is an antiseptic cleaner which kills germs and bonds with the skin to continue killing germs even after washing. Please DO NOT  use if you have an allergy to CHG or antibacterial soaps.   If your skin becomes reddened/irritated stop using the CHG and inform your nurse when you arrive at Short Stay. Do not shave (including legs and underarms) for at least 48 hours prior to the first CHG shower.  Please follow these instructions carefully:  1.  Shower with CHG Soap the night before surgery and the  morning of Surgery.  2.  If you choose to wash your hair, wash your hair first as usual with your  normal  shampoo.  3.  After you shampoo, rinse your hair and body thoroughly to remove the  shampoo.                                        4.  Use CHG as you would any other liquid soap.  You can apply chg directly  to the skin and wash                       Gently with a scrungie or clean washcloth.  5.  Apply the CHG Soap to your body ONLY FROM THE NECK DOWN.   Do not use on face/ open                           Wound or open sores. Avoid contact with eyes, ears mouth and genitals (private parts).                       Wash face,  Genitals (private parts) with your normal soap.             6.  Wash thoroughly, paying special attention to the area where your surgery  will be performed.  7.  Thoroughly rinse your body  with warm water from the neck down.  8.  DO NOT shower/wash with your normal soap after using and rinsing off  the CHG Soap.             9.  Pat yourself dry with a clean towel.            10.  Wear clean pajamas.            11.  Place clean sheets on your bed the night of your first shower and do not  sleep with pets. Day of Surgery : Do not apply any lotions/deodorants the morning of surgery.  Please wear clean clothes to the hospital/surgery center.  FAILURE TO FOLLOW THESE INSTRUCTIONS MAY RESULT IN THE CANCELLATION OF YOUR SURGERY PATIENT SIGNATURE_________________________________  NURSE SIGNATURE__________________________________  ________________________________________________________________________

## 2019-12-20 ENCOUNTER — Other Ambulatory Visit: Payer: Self-pay

## 2019-12-20 ENCOUNTER — Encounter (HOSPITAL_COMMUNITY): Payer: Self-pay

## 2019-12-20 ENCOUNTER — Other Ambulatory Visit (HOSPITAL_COMMUNITY)
Admission: RE | Admit: 2019-12-20 | Discharge: 2019-12-20 | Disposition: A | Discharge status: Home / Self Care | Payer: 59 | Source: Ambulatory Visit | Attending: Orthopedic Surgery | Admitting: Orthopedic Surgery

## 2019-12-20 ENCOUNTER — Encounter (HOSPITAL_COMMUNITY)
Admission: RE | Admit: 2019-12-20 | Discharge: 2019-12-20 | Disposition: A | Discharge status: Home / Self Care | Payer: 59 | Source: Ambulatory Visit | Attending: Orthopedic Surgery | Admitting: Orthopedic Surgery

## 2019-12-20 DIAGNOSIS — Z20822 Contact with and (suspected) exposure to covid-19: Secondary | ICD-10-CM | POA: Diagnosis not present

## 2019-12-20 DIAGNOSIS — Z01812 Encounter for preprocedural laboratory examination: Secondary | ICD-10-CM | POA: Insufficient documentation

## 2019-12-20 HISTORY — DX: Depression, unspecified: F32.A

## 2019-12-20 HISTORY — DX: Anxiety disorder, unspecified: F41.9

## 2019-12-20 HISTORY — DX: Cardiac murmur, unspecified: R01.1

## 2019-12-20 HISTORY — DX: Unspecified osteoarthritis, unspecified site: M19.90

## 2019-12-20 LAB — CBC
HCT: 34.7 % — ABNORMAL LOW (ref 36.0–46.0)
Hemoglobin: 11.4 g/dL — ABNORMAL LOW (ref 12.0–15.0)
MCH: 31.8 pg (ref 26.0–34.0)
MCHC: 32.9 g/dL (ref 30.0–36.0)
MCV: 96.7 fL (ref 80.0–100.0)
Platelets: 305 10*3/uL (ref 150–400)
RBC: 3.59 MIL/uL — ABNORMAL LOW (ref 3.87–5.11)
RDW: 11.5 % (ref 11.5–15.5)
WBC: 4.1 10*3/uL (ref 4.0–10.5)
nRBC: 0 % (ref 0.0–0.2)

## 2019-12-20 LAB — COMPREHENSIVE METABOLIC PANEL
ALT: 16 U/L (ref 0–44)
AST: 20 U/L (ref 15–41)
Albumin: 4.2 g/dL (ref 3.5–5.0)
Alkaline Phosphatase: 88 U/L (ref 38–126)
Anion gap: 8 (ref 5–15)
BUN: 15 mg/dL (ref 6–20)
CO2: 28 mmol/L (ref 22–32)
Calcium: 9 mg/dL (ref 8.9–10.3)
Chloride: 101 mmol/L (ref 98–111)
Creatinine, Ser: 0.79 mg/dL (ref 0.44–1.00)
GFR, Estimated: >60 mL/min (ref >60)
Glucose, Bld: 110 mg/dL — ABNORMAL HIGH (ref 70–99)
Potassium: 5.2 mmol/L — ABNORMAL HIGH (ref 3.5–5.1)
Sodium: 137 mmol/L (ref 135–145)
Total Bilirubin: 0.4 mg/dL (ref 0.3–1.2)
Total Protein: 7.1 g/dL (ref 6.5–8.1)

## 2019-12-20 LAB — SURGICAL PCR SCREEN
MRSA, PCR: NEGATIVE
Staphylococcus aureus: POSITIVE — AB

## 2019-12-20 LAB — APTT: aPTT: 32 seconds (ref 24–36)

## 2019-12-20 LAB — SARS CORONAVIRUS 2 (TAT 6-24 HRS): SARS Coronavirus 2: NEGATIVE

## 2019-12-20 LAB — PROTIME-INR
INR: 0.9 (ref 0.8–1.2)
Prothrombin Time: 12 seconds (ref 11.4–15.2)

## 2019-12-20 NOTE — Progress Notes (Signed)
COVID Vaccine Completed:Yes Date COVID Vaccine completed:07/16/19 COVID vaccine manufacturer:   Moderna     PCP - Tomma Rakers FNP Cardiologist - Dr. Carmine Savoy.  [facility]. [place], [place]  Chest x-ray - no EKG - 11/111/21- requested Stress Test - 12/16/19- requested ECHO - no Cardiac Cath -no  Pacemaker/ICD device last checked:NA  Sleep Study - no CPAP -   Fasting Blood Sugar - NA Checks Blood Sugar _____ times a day  Blood Thinner Instructions:NA Aspirin Instructions: Last Dose:  Anesthesia review:   Patient denies shortness of breath, fever, cough and chest pain at PAT appointment yes   Patient verbalized understanding of instructions that were given to them at the PAT appointment. Patient was also instructed that they will need to review over the PAT instructions again at home before surgery. Yes Pt was sent to a cardiologist for a valve issue. A work up was done and sent to Dr. Deri Fuelling office. Pt has no SOB climbing stairs, doing housework or with ADLs.

## 2019-12-21 ENCOUNTER — Encounter (HOSPITAL_COMMUNITY): Payer: Self-pay | Admitting: Orthopedic Surgery

## 2019-12-21 NOTE — H&P (Signed)
TOTAL HIP ADMISSION H&P  Patient is admitted for right total hip arthroplasty.  Subjective:  Chief Complaint: right hip pain  HPI: Carla Ali, 59 y.o. female, has a history of pain and functional disability in the right hip(s) due to arthritis and patient has failed non-surgical conservative treatments for greater than 12 weeks to include corticosteriod injections and activity modification.  Onset of symptoms was gradual starting 2 years ago with gradually worsening course since that time.The patient noted no past surgery on the right hip(s).  Patient currently rates pain in the right hip at 10 out of 10 with activity. Patient has worsening of pain with activity and weight bearing, pain that interfers with activities of daily living and pain with passive range of motion. Patient has evidence of joint space narrowing by imaging studies. This condition presents safety issues increasing the risk of falls.  There is no current active infection.  Patient Active Problem List   Diagnosis Date Noted  . Special screening for malignant neoplasms, colon   . Family history of malignant neoplasm of gastrointestinal tract    Past Medical History:  Diagnosis Date  . Anxiety   . Arthritis    back and hip  . Depression   . Heart murmur   . Hypertension   . Hypothyroidism     Past Surgical History:  Procedure Laterality Date  . ABDOMINAL HYSTERECTOMY    . BREAST BIOPSY Right 2010   benign  . COLONOSCOPY    . COLONOSCOPY N/A 11/18/2017   Procedure: COLONOSCOPY;  Surgeon: Franky Macho, MD;  Location: [facility];  Service: Gastroenterology;  Laterality: N/A;  . cyst removed from back      No current facility-administered medications for this encounter.   Current Outpatient Medications  Medication Sig Dispense Refill Last Dose  . amLODipine (NORVASC) 5 MG tablet Take 5 mg by mouth at bedtime.     Marland Kitchen levothyroxine (SYNTHROID, LEVOTHROID) 50 MCG tablet Take 50 mcg by mouth daily before  breakfast.     . lisinopril-hydrochlorothiazide (ZESTORETIC) 10-12.5 MG tablet Take 1 tablet by mouth in the morning.     . meloxicam (MOBIC) 7.5 MG tablet Take 7.5 mg by mouth in the morning and at bedtime.     Marland Kitchen oxyCODONE (ROXICODONE) 15 MG immediate release tablet Take 15 mg by mouth in the morning, at noon, in the evening, and at bedtime.     . potassium chloride (K-DUR) 10 MEQ tablet Take 10 mEq by mouth 2 (two) times daily.     Marland Kitchen venlafaxine (EFFEXOR) 37.5 MG tablet Take 37.5 mg by mouth 2 (two) times daily.     . Cholecalciferol (VITAMIN D3) 5000 units CAPS Take 1 capsule by mouth daily. (Patient not taking: Reported on 12/17/2019)   Not Taking at Unknown time  . citalopram (CELEXA) 40 MG tablet Take 40 mg by mouth daily. (Patient not taking: Reported on 12/17/2019)   Not Taking at Unknown time  . diclofenac (VOLTAREN) 75 MG EC tablet Take 75 mg by mouth 2 (two) times daily. (Patient not taking: Reported on 12/17/2019)   Not Taking at Unknown time  . etodolac (LODINE) 500 MG tablet Take 500 mg by mouth 2 (two) times daily. (Patient not taking: Reported on 12/17/2019)   Not Taking at Unknown time  . folic acid (FOLVITE) 1 MG tablet Take 1 mg by mouth daily. (Patient not taking: Reported on 12/17/2019)   Not Taking at Unknown time  . hydrochlorothiazide (HYDRODIURIL) 25 MG tablet Take  25 mg by mouth daily. (Patient not taking: Reported on 12/17/2019)   Not Taking at Unknown time  . Methylcobalamin (B-12) 5000 MCG TBDP Take 1 tablet by mouth daily. (Patient not taking: Reported on 12/17/2019)   Not Taking at Unknown time  . Misc Natural Products (OSTEO BI-FLEX ADV JOINT SHIELD PO) Take 1 tablet by mouth daily. (Patient not taking: Reported on 12/17/2019)   Not Taking at Unknown time   Allergies  Allergen Reactions  . Irbesartan Hives    Social History   Tobacco Use  . Smoking status: Former Smoker    Packs/day: 0.50    Years: 15.00    Pack years: 7.50    Types: Cigarettes    Quit date:  12/19/2004    Years since quitting: 15.0  . Smokeless tobacco: Never Used  Substance Use Topics  . Alcohol use: Not Currently    Family History  Problem Relation Age of Onset  . Breast cancer Paternal Aunt   . Colon cancer Mother      Review of Systems  Constitutional: Negative for chills and fever.  Respiratory: Negative for cough and shortness of breath.   Cardiovascular: Negative for chest pain.  Gastrointestinal: Negative for nausea and vomiting.  Musculoskeletal: Positive for arthralgias.    Objective:  Physical Exam Patient is a 59 year old female.  Well nourished and well developed. General: Alert and oriented x3, cooperative and pleasant, no acute distress. Head: normocephalic, atraumatic, neck supple. Eyes: EOMI. Respiratory: breath sounds clear in all fields, no wheezing, rales, or rhonchi. Cardiovascular: Regular rate and rhythm, no murmurs, gallops or rubs. Abdomen: non-tender to palpation and soft, normoactive bowel sounds. Musculoskeletal:  Right Hip Exam: ROM: Flexion to 100, Internal Rotation 0, External Rotation 10 to 20, and abduction 20 with pain on all of those ranges of motion. Calves soft and nontender. Motor function intact in LE. Strength 5/5 LE bilaterally. Neuro: Distal pulses 2+. Sensation to light touch intact in LE.  Vital signs in last 24 hours:    Labs:   Estimated body mass index is 30.84 kg/m as calculated from the following:   Height as of 12/20/19: 5' 6.5" (1.689 m).   Weight as of 12/20/19: 88 kg.   Imaging Review Plain radiographs demonstrate severe degenerative joint disease of the right hip(s). The bone quality appears to be adequate for age and reported activity level.      Assessment/Plan:  End stage arthritis, right hip(s)  The patient history, physical examination, clinical judgement of the provider and imaging studies are consistent with end stage degenerative joint disease of the right hip(s) and total hip  arthroplasty is deemed medically necessary. The treatment options including medical management, injection therapy, arthroscopy and arthroplasty were discussed at length. The risks and benefits of total hip arthroplasty were presented and reviewed. The risks due to aseptic loosening, infection, stiffness, dislocation/subluxation,  thromboembolic complications and other imponderables were discussed.  The patient acknowledged the explanation, agreed to proceed with the plan and consent was signed. Patient is being admitted for inpatient treatment for surgery, pain control, PT, OT, prophylactic antibiotics, VTE prophylaxis, progressive ambulation and ADL's and discharge planning.The patient is planning to be discharged home.  Therapy Plans: HHPT Disposition: Home with husband Planned DVT Prophylaxis: aspirin 325mg  BID DME needed: walker PCP: , FNP, appointment on Friday 11/5 TXA: IV Allergies: irbesartan - rash, singulair - unsure Anesthesia Concerns: none BMI: 31.4 Not diabetic.  Other: Oxycodone 15 mg q6h, in pain management with Dr. 11-08-2000 in [place].  -  Patient was instructed on what medications to stop prior to surgery. - Follow-up visit in 2 weeks with Dr. Lequita Halt - Begin physical therapy following surgery - Pre-operative lab work as pre-surgical testing - Prescriptions will be provided in hospital at time of discharge  Dennie Bible, PA-C Orthopedic Surgery [facility] [phone]

## 2019-12-22 ENCOUNTER — Ambulatory Visit (HOSPITAL_COMMUNITY): Payer: 59 | Admitting: Anesthesiology

## 2019-12-22 ENCOUNTER — Ambulatory Visit (HOSPITAL_COMMUNITY): Payer: 59

## 2019-12-22 ENCOUNTER — Other Ambulatory Visit: Payer: Self-pay

## 2019-12-22 ENCOUNTER — Encounter (HOSPITAL_COMMUNITY): Payer: Self-pay | Admitting: Orthopedic Surgery

## 2019-12-22 ENCOUNTER — Observation Stay (HOSPITAL_COMMUNITY)
Admission: RE | Admit: 2019-12-22 | Discharge: 2019-12-23 | Disposition: A | Discharge status: Home / Self Care | Payer: 59 | Attending: Orthopedic Surgery | Admitting: Orthopedic Surgery

## 2019-12-22 ENCOUNTER — Observation Stay (HOSPITAL_COMMUNITY): Payer: 59

## 2019-12-22 ENCOUNTER — Encounter (HOSPITAL_COMMUNITY)
Admission: RE | Disposition: A | Discharge status: Home / Self Care | Payer: Self-pay | Source: Home / Self Care | Attending: Orthopedic Surgery

## 2019-12-22 DIAGNOSIS — M169 Osteoarthritis of hip, unspecified: Secondary | ICD-10-CM

## 2019-12-22 DIAGNOSIS — E039 Hypothyroidism, unspecified: Secondary | ICD-10-CM | POA: Insufficient documentation

## 2019-12-22 DIAGNOSIS — Z23 Encounter for immunization: Secondary | ICD-10-CM | POA: Diagnosis not present

## 2019-12-22 DIAGNOSIS — Z79899 Other long term (current) drug therapy: Secondary | ICD-10-CM | POA: Insufficient documentation

## 2019-12-22 DIAGNOSIS — Z87891 Personal history of nicotine dependence: Secondary | ICD-10-CM | POA: Insufficient documentation

## 2019-12-22 DIAGNOSIS — M1611 Unilateral primary osteoarthritis, right hip: Principal | ICD-10-CM | POA: Insufficient documentation

## 2019-12-22 DIAGNOSIS — Z96649 Presence of unspecified artificial hip joint: Secondary | ICD-10-CM

## 2019-12-22 DIAGNOSIS — I10 Essential (primary) hypertension: Secondary | ICD-10-CM | POA: Insufficient documentation

## 2019-12-22 DIAGNOSIS — M25551 Pain in right hip: Secondary | ICD-10-CM | POA: Diagnosis present

## 2019-12-22 DIAGNOSIS — Z419 Encounter for procedure for purposes other than remedying health state, unspecified: Secondary | ICD-10-CM

## 2019-12-22 HISTORY — PX: TOTAL HIP ARTHROPLASTY: SHX124

## 2019-12-22 LAB — TYPE AND SCREEN
ABO/RH(D): O POS
Antibody Screen: NEGATIVE

## 2019-12-22 LAB — ABO/RH: ABO/RH(D): O POS

## 2019-12-22 SURGERY — ARTHROPLASTY, HIP, TOTAL, ANTERIOR APPROACH
Anesthesia: General | Site: Hip | Laterality: Right

## 2019-12-22 MED ORDER — VENLAFAXINE HCL 37.5 MG PO TABS
37.5000 mg | ORAL_TABLET | Freq: Two times a day (BID) | ORAL | Status: DC
  Administered 2019-12-22 – 2019-12-23 (×2): 37.5 mg via ORAL
  Filled 2019-12-22 (×2): qty 1

## 2019-12-22 MED ORDER — METHOCARBAMOL 500 MG PO TABS
500.0000 mg | ORAL_TABLET | Freq: Four times a day (QID) | ORAL | Status: DC | PRN
  Administered 2019-12-22 – 2019-12-23 (×2): 500 mg via ORAL
  Filled 2019-12-22 (×2): qty 1

## 2019-12-22 MED ORDER — OXYCODONE HCL ER 15 MG PO T12A: 15.0000 mg | EXTENDED_RELEASE_TABLET | Freq: Four times a day (QID) | ORAL | Status: DC

## 2019-12-22 MED ORDER — ORAL CARE MOUTH RINSE: 15.0000 mL | Freq: Once | OROMUCOSAL | Status: AC

## 2019-12-22 MED ORDER — MIDAZOLAM HCL 2 MG/2ML IJ SOLN
INTRAMUSCULAR | Status: AC
  Filled 2019-12-22: qty 2

## 2019-12-22 MED ORDER — LACTATED RINGERS IV SOLN: INTRAVENOUS | Status: DC

## 2019-12-22 MED ORDER — CEFAZOLIN SODIUM-DEXTROSE 2-4 GM/100ML-% IV SOLN
2.0000 g | Freq: Four times a day (QID) | INTRAVENOUS | Status: AC
  Administered 2019-12-22 (×2): 2 g via INTRAVENOUS
  Filled 2019-12-22 (×2): qty 100

## 2019-12-22 MED ORDER — MIDAZOLAM HCL 5 MG/5ML IJ SOLN
INTRAMUSCULAR | Status: DC | PRN
  Administered 2019-12-22 (×2): 2 mg via INTRAVENOUS

## 2019-12-22 MED ORDER — PROPOFOL 1000 MG/100ML IV EMUL
INTRAVENOUS | Status: AC
  Filled 2019-12-22: qty 100

## 2019-12-22 MED ORDER — PROPOFOL 500 MG/50ML IV EMUL
INTRAVENOUS | Status: DC | PRN
  Administered 2019-12-22: 75 ug/kg/min via INTRAVENOUS

## 2019-12-22 MED ORDER — DEXAMETHASONE SODIUM PHOSPHATE 10 MG/ML IJ SOLN
INTRAMUSCULAR | Status: DC | PRN
  Administered 2019-12-22: 8 mg via INTRAVENOUS

## 2019-12-22 MED ORDER — OXYCODONE HCL 5 MG/5ML PO SOLN: 5.0000 mg | Freq: Once | ORAL | Status: DC | PRN

## 2019-12-22 MED ORDER — PHENOL 1.4 % MT LIQD: 1.0000 | OROMUCOSAL | Status: DC | PRN

## 2019-12-22 MED ORDER — INFLUENZA VAC SPLIT QUAD 0.5 ML IM SUSY
0.5000 mL | PREFILLED_SYRINGE | INTRAMUSCULAR | Status: AC
  Administered 2019-12-23: 0.5 mL via INTRAMUSCULAR
  Filled 2019-12-22: qty 0.5

## 2019-12-22 MED ORDER — ONDANSETRON HCL 4 MG/2ML IJ SOLN: 4.0000 mg | Freq: Once | INTRAMUSCULAR | Status: DC | PRN

## 2019-12-22 MED ORDER — OXYCODONE HCL 5 MG PO TABS
15.0000 mg | ORAL_TABLET | Freq: Four times a day (QID) | ORAL | Status: DC
  Administered 2019-12-22 – 2019-12-23 (×4): 15 mg via ORAL
  Filled 2019-12-22 (×4): qty 3

## 2019-12-22 MED ORDER — FENTANYL CITRATE (PF) 100 MCG/2ML IJ SOLN
INTRAMUSCULAR | Status: AC
  Filled 2019-12-22: qty 2

## 2019-12-22 MED ORDER — OXYCODONE HCL 5 MG PO TABS: 15.0000 mg | ORAL_TABLET | Freq: Three times a day (TID) | ORAL | Status: DC | PRN

## 2019-12-22 MED ORDER — ONDANSETRON HCL 4 MG PO TABS: 4.0000 mg | ORAL_TABLET | Freq: Four times a day (QID) | ORAL | Status: DC | PRN

## 2019-12-22 MED ORDER — OXYCODONE HCL 5 MG PO TABS
5.0000 mg | ORAL_TABLET | Freq: Four times a day (QID) | ORAL | 0 refills | Status: AC | PRN
Start: 2019-12-22 — End: 2020-12-21

## 2019-12-22 MED ORDER — ONDANSETRON HCL 4 MG/2ML IJ SOLN: 4.0000 mg | Freq: Four times a day (QID) | INTRAMUSCULAR | Status: DC | PRN

## 2019-12-22 MED ORDER — AMLODIPINE BESYLATE 5 MG PO TABS
5.0000 mg | ORAL_TABLET | Freq: Every day | ORAL | Status: DC
  Filled 2019-12-22: qty 1

## 2019-12-22 MED ORDER — CHLORHEXIDINE GLUCONATE 0.12 % MT SOLN
15.0000 mL | Freq: Once | OROMUCOSAL | Status: AC
  Administered 2019-12-22: 15 mL via OROMUCOSAL

## 2019-12-22 MED ORDER — LEVOTHYROXINE SODIUM 50 MCG PO TABS
50.0000 ug | ORAL_TABLET | Freq: Every day | ORAL | Status: DC
  Administered 2019-12-23: 50 ug via ORAL
  Filled 2019-12-22: qty 1

## 2019-12-22 MED ORDER — TRANEXAMIC ACID-NACL 1000-0.7 MG/100ML-% IV SOLN
1000.0000 mg | INTRAVENOUS | Status: AC
  Administered 2019-12-22: 1000 mg via INTRAVENOUS
  Filled 2019-12-22: qty 100

## 2019-12-22 MED ORDER — BUPIVACAINE HCL 0.25 % IJ SOLN
INTRAMUSCULAR | Status: DC | PRN
  Administered 2019-12-22: 30 mL

## 2019-12-22 MED ORDER — DEXAMETHASONE SODIUM PHOSPHATE 10 MG/ML IJ SOLN: 8.0000 mg | Freq: Once | INTRAMUSCULAR | Status: DC

## 2019-12-22 MED ORDER — METOCLOPRAMIDE HCL 5 MG/ML IJ SOLN: 5.0000 mg | Freq: Three times a day (TID) | INTRAMUSCULAR | Status: DC | PRN

## 2019-12-22 MED ORDER — METOCLOPRAMIDE HCL 5 MG PO TABS: 5.0000 mg | ORAL_TABLET | Freq: Three times a day (TID) | ORAL | Status: DC | PRN

## 2019-12-22 MED ORDER — CEFAZOLIN SODIUM-DEXTROSE 2-4 GM/100ML-% IV SOLN
2.0000 g | INTRAVENOUS | Status: AC
  Administered 2019-12-22: 2 g via INTRAVENOUS
  Filled 2019-12-22: qty 100

## 2019-12-22 MED ORDER — PHENYLEPHRINE HCL-NACL 10-0.9 MG/250ML-% IV SOLN
INTRAVENOUS | Status: DC | PRN
  Administered 2019-12-22: 25 ug/min via INTRAVENOUS

## 2019-12-22 MED ORDER — METHOCARBAMOL 500 MG PO TABS: 500.0000 mg | ORAL_TABLET | Freq: Four times a day (QID) | ORAL | 0 refills | Status: AC | PRN

## 2019-12-22 MED ORDER — MAGNESIUM CITRATE PO SOLN: 1.0000 | Freq: Once | ORAL | Status: DC | PRN

## 2019-12-22 MED ORDER — FENTANYL CITRATE (PF) 100 MCG/2ML IJ SOLN
INTRAMUSCULAR | Status: DC | PRN
  Administered 2019-12-22: 25 ug via INTRAVENOUS
  Administered 2019-12-22: 50 ug via INTRAVENOUS
  Administered 2019-12-22: 25 ug via INTRAVENOUS

## 2019-12-22 MED ORDER — MEPERIDINE HCL 50 MG/ML IJ SOLN: 6.2500 mg | INTRAMUSCULAR | Status: DC | PRN

## 2019-12-22 MED ORDER — ACETAMINOPHEN 500 MG PO TABS
1000.0000 mg | ORAL_TABLET | Freq: Four times a day (QID) | ORAL | Status: DC
  Administered 2019-12-22 – 2019-12-23 (×3): 1000 mg via ORAL
  Filled 2019-12-22 (×4): qty 2

## 2019-12-22 MED ORDER — POVIDONE-IODINE 10 % EX SWAB
2.0000 "application " | Freq: Once | CUTANEOUS | Status: AC
  Administered 2019-12-22: 2 via TOPICAL

## 2019-12-22 MED ORDER — ACETAMINOPHEN 325 MG PO TABS: 325.0000 mg | ORAL_TABLET | ORAL | Status: DC | PRN

## 2019-12-22 MED ORDER — ACETAMINOPHEN 10 MG/ML IV SOLN
1000.0000 mg | Freq: Four times a day (QID) | INTRAVENOUS | Status: DC
  Administered 2019-12-22: 1000 mg via INTRAVENOUS
  Filled 2019-12-22: qty 100

## 2019-12-22 MED ORDER — POTASSIUM CHLORIDE CRYS ER 10 MEQ PO TBCR
10.0000 meq | EXTENDED_RELEASE_TABLET | Freq: Two times a day (BID) | ORAL | Status: DC
  Administered 2019-12-22 – 2019-12-23 (×2): 10 meq via ORAL
  Filled 2019-12-22 (×4): qty 1

## 2019-12-22 MED ORDER — HYDROCHLOROTHIAZIDE 12.5 MG PO CAPS
12.5000 mg | ORAL_CAPSULE | Freq: Every day | ORAL | Status: DC
  Administered 2019-12-23: 12.5 mg via ORAL
  Filled 2019-12-22: qty 1

## 2019-12-22 MED ORDER — POLYETHYLENE GLYCOL 3350 17 G PO PACK: 17.0000 g | PACK | Freq: Every day | ORAL | Status: DC | PRN

## 2019-12-22 MED ORDER — MORPHINE SULFATE (PF) 4 MG/ML IV SOLN
0.5000 mg | INTRAVENOUS | Status: DC | PRN
  Administered 2019-12-22: 1 mg via INTRAVENOUS
  Filled 2019-12-22: qty 1

## 2019-12-22 MED ORDER — ASPIRIN EC 325 MG PO TBEC: 325.0000 mg | DELAYED_RELEASE_TABLET | Freq: Two times a day (BID) | ORAL | 0 refills | Status: AC

## 2019-12-22 MED ORDER — WATER FOR IRRIGATION, STERILE IR SOLN
Status: DC | PRN
  Administered 2019-12-22: 2000 mL

## 2019-12-22 MED ORDER — BUPIVACAINE HCL (PF) 0.25 % IJ SOLN
INTRAMUSCULAR | Status: AC
  Filled 2019-12-22: qty 30

## 2019-12-22 MED ORDER — BISACODYL 10 MG RE SUPP: 10.0000 mg | Freq: Every day | RECTAL | Status: DC | PRN

## 2019-12-22 MED ORDER — ONDANSETRON HCL 4 MG/2ML IJ SOLN
INTRAMUSCULAR | Status: DC | PRN
  Administered 2019-12-22: 4 mg via INTRAVENOUS

## 2019-12-22 MED ORDER — SODIUM CHLORIDE 0.9 % IV SOLN: INTRAVENOUS | Status: DC

## 2019-12-22 MED ORDER — OXYCODONE HCL 5 MG PO TABS: 5.0000 mg | ORAL_TABLET | Freq: Once | ORAL | Status: DC | PRN

## 2019-12-22 MED ORDER — OXYCODONE HCL 5 MG PO TABS
5.0000 mg | ORAL_TABLET | ORAL | Status: DC | PRN
  Administered 2019-12-22 – 2019-12-23 (×5): 10 mg via ORAL
  Filled 2019-12-22 (×5): qty 2

## 2019-12-22 MED ORDER — BUPIVACAINE IN DEXTROSE 0.75-8.25 % IT SOLN
INTRATHECAL | Status: DC | PRN
  Administered 2019-12-22: 1.8 mL via INTRATHECAL

## 2019-12-22 MED ORDER — LACTATED RINGERS IV SOLN
INTRAVENOUS | Status: DC
  Administered 2019-12-22: 1000 mL via INTRAVENOUS

## 2019-12-22 MED ORDER — ASPIRIN EC 325 MG PO TBEC
325.0000 mg | DELAYED_RELEASE_TABLET | Freq: Two times a day (BID) | ORAL | Status: DC
  Administered 2019-12-23: 325 mg via ORAL
  Filled 2019-12-22: qty 1

## 2019-12-22 MED ORDER — METHOCARBAMOL 500 MG IVPB - SIMPLE MED
INTRAVENOUS | Status: AC
  Filled 2019-12-22: qty 50

## 2019-12-22 MED ORDER — MENTHOL 3 MG MT LOZG: 1.0000 | LOZENGE | OROMUCOSAL | Status: DC | PRN

## 2019-12-22 MED ORDER — METHOCARBAMOL 500 MG IVPB - SIMPLE MED
500.0000 mg | Freq: Four times a day (QID) | INTRAVENOUS | Status: DC | PRN
  Administered 2019-12-22: 500 mg via INTRAVENOUS
  Filled 2019-12-22: qty 50

## 2019-12-22 MED ORDER — PROPOFOL 10 MG/ML IV BOLUS
INTRAVENOUS | Status: DC | PRN
  Administered 2019-12-22: 40 mg via INTRAVENOUS
  Administered 2019-12-22: 10 mg via INTRAVENOUS
  Administered 2019-12-22: 20 mg via INTRAVENOUS

## 2019-12-22 MED ORDER — DEXAMETHASONE SODIUM PHOSPHATE 10 MG/ML IJ SOLN
10.0000 mg | Freq: Once | INTRAMUSCULAR | Status: AC
  Administered 2019-12-23: 10 mg via INTRAVENOUS
  Filled 2019-12-22: qty 1

## 2019-12-22 MED ORDER — FENTANYL CITRATE (PF) 100 MCG/2ML IJ SOLN
25.0000 ug | INTRAMUSCULAR | Status: DC | PRN
  Administered 2019-12-22 (×2): 50 ug via INTRAVENOUS

## 2019-12-22 MED ORDER — 0.9 % SODIUM CHLORIDE (POUR BTL) OPTIME
TOPICAL | Status: DC | PRN
  Administered 2019-12-22: 1000 mL

## 2019-12-22 MED ORDER — DOCUSATE SODIUM 100 MG PO CAPS
100.0000 mg | ORAL_CAPSULE | Freq: Two times a day (BID) | ORAL | Status: DC
  Administered 2019-12-22 – 2019-12-23 (×2): 100 mg via ORAL
  Filled 2019-12-22 (×2): qty 1

## 2019-12-22 MED ORDER — MORPHINE SULFATE (PF) 2 MG/ML IV SOLN
1.0000 mg | INTRAVENOUS | Status: DC | PRN
  Administered 2019-12-23 (×2): 1 mg via INTRAVENOUS
  Filled 2019-12-22 (×2): qty 1

## 2019-12-22 MED ORDER — ACETAMINOPHEN 160 MG/5ML PO SOLN: 325.0000 mg | ORAL | Status: DC | PRN

## 2019-12-22 SURGICAL SUPPLY — 45 items
BAG DECANTER FOR FLEXI CONT (MISCELLANEOUS) IMPLANT
BAG SPEC THK2 15X12 ZIP CLS (MISCELLANEOUS)
BAG ZIPLOCK 12X15 (MISCELLANEOUS) IMPLANT
BLADE SAG 18X100X1.27 (BLADE) ×3 IMPLANT
CLOSURE WOUND 1/2 X4 (GAUZE/BANDAGES/DRESSINGS) ×1
COVER PERINEAL POST (MISCELLANEOUS) ×3 IMPLANT
COVER SURGICAL LIGHT HANDLE (MISCELLANEOUS) ×3 IMPLANT
COVER WAND RF STERILE (DRAPES) IMPLANT
CUP ACETBLR 48 OD SECTOR II (Hips) ×3 IMPLANT
DECANTER SPIKE VIAL GLASS SM (MISCELLANEOUS) ×3 IMPLANT
DRAPE STERI IOBAN 125X83 (DRAPES) ×3 IMPLANT
DRAPE U-SHAPE 47X51 STRL (DRAPES) ×6 IMPLANT
DRSG ADAPTIC 3X8 NADH LF (GAUZE/BANDAGES/DRESSINGS) ×3 IMPLANT
DRSG AQUACEL AG ADV 3.5X10 (GAUZE/BANDAGES/DRESSINGS) ×3 IMPLANT
DURAPREP 26ML APPLICATOR (WOUND CARE) ×3 IMPLANT
ELECT REM PT RETURN 15FT ADLT (MISCELLANEOUS) ×3 IMPLANT
EVACUATOR 1/8 PVC DRAIN (DRAIN) IMPLANT
GLOVE BIO SURGEON STRL SZ 6 (GLOVE) IMPLANT
GLOVE BIO SURGEON STRL SZ7 (GLOVE) IMPLANT
GLOVE BIO SURGEON STRL SZ8 (GLOVE) ×3 IMPLANT
GLOVE BIOGEL PI IND STRL 6.5 (GLOVE) IMPLANT
GLOVE BIOGEL PI IND STRL 7.0 (GLOVE) IMPLANT
GLOVE BIOGEL PI IND STRL 8 (GLOVE) ×1 IMPLANT
GLOVE BIOGEL PI INDICATOR 6.5 (GLOVE)
GLOVE BIOGEL PI INDICATOR 7.0 (GLOVE)
GLOVE BIOGEL PI INDICATOR 8 (GLOVE) ×2
GOWN STRL REUS W/TWL LRG LVL3 (GOWN DISPOSABLE) ×3 IMPLANT
GOWN STRL REUS W/TWL XL LVL3 (GOWN DISPOSABLE) IMPLANT
HEAD CERAMIC DELTA 28M 12/14P5 (Head) ×3 IMPLANT
HOLDER FOLEY CATH W/STRAP (MISCELLANEOUS) ×3 IMPLANT
KIT TURNOVER KIT A (KITS) IMPLANT
LINER MARATHON 28 48 (Hips) ×3 IMPLANT
MANIFOLD NEPTUNE II (INSTRUMENTS) ×3 IMPLANT
PACK ANTERIOR HIP CUSTOM (KITS) ×3 IMPLANT
PENCIL SMOKE EVACUATOR COATED (MISCELLANEOUS) ×3 IMPLANT
STEM FEM ACTIS HIGH SZ3 (Stem) ×3 IMPLANT
STRIP CLOSURE SKIN 1/2X4 (GAUZE/BANDAGES/DRESSINGS) ×2 IMPLANT
SUT ETHIBOND NAB CT1 #1 30IN (SUTURE) ×3 IMPLANT
SUT MNCRL AB 4-0 PS2 18 (SUTURE) ×3 IMPLANT
SUT STRATAFIX 0 PDS 27 VIOLET (SUTURE) ×3
SUT VIC AB 2-0 CT1 27 (SUTURE) ×6
SUT VIC AB 2-0 CT1 TAPERPNT 27 (SUTURE) ×2 IMPLANT
SUTURE STRATFX 0 PDS 27 VIOLET (SUTURE) ×1 IMPLANT
SYR 50ML LL SCALE MARK (SYRINGE) IMPLANT
TRAY FOLEY MTR SLVR 16FR STAT (SET/KITS/TRAYS/PACK) ×3 IMPLANT

## 2019-12-22 NOTE — Discharge Instructions (Signed)
Frank Aluisio, MD Total Joint Specialist [facility] [address] [place], [place] [address] [phone]  ANTERIOR APPROACH TOTAL HIP REPLACEMENT POSTOPERATIVE DIRECTIONS     Hip Rehabilitation, Guidelines Following Surgery  The results of a hip operation are greatly improved after range of motion and muscle strengthening exercises. Follow all safety measures which are given to protect your hip. If any of these exercises cause increased pain or swelling in your joint, decrease the amount until you are comfortable again. Then slowly increase the exercises. Call your caregiver if you have problems or questions.   BLOOD CLOT PREVENTION . Take a 325 mg Aspirin two times a day for three weeks following surgery. Then take an 81 mg Aspirin once a day for three weeks. Then discontinue Aspirin. . You may resume your vitamins/supplements upon discharge from the hospital. . Do not take any NSAIDs (Advil, Aleve, Ibuprofen, Meloxicam, etc.) until you have discontinued the 325 mg Aspirin.  HOME CARE INSTRUCTIONS  . Remove items at home which could result in a fall. This includes throw rugs or furniture in walking pathways.   ICE to the affected hip as frequently as 20-30 minutes an hour and then as needed for pain and swelling. Continue to use ice on the hip for pain and swelling from surgery. You may notice swelling that will progress down to the foot and ankle. This is normal after surgery. Elevate the leg when you are not up walking on it.    Continue to use the breathing machine which will help keep your temperature down.  It is common for your temperature to cycle up and down following surgery, especially at night when you are not up moving around and exerting yourself.  The breathing machine keeps your lungs expanded and your temperature down.  DIET You may resume your previous home diet once your are discharged from the hospital.  DRESSING / WOUND CARE /  SHOWERING . You have an adhesive waterproof bandage over the incision. Leave this in place until your first follow-up appointment. Once you remove this you will not need to place another bandage.  . You may begin showering 3 days following surgery, but do not submerge the incision under water.  ACTIVITY . For the first 3-5 days, it is important to rest and keep the operative leg elevated. You should, as a general rule, rest for 50 minutes and walk/stretch for 10 minutes per hour. After 5 days, you may slowly increase activity as tolerated.  . Perform the exercises you were provided twice a day for about 15-20 minutes each session. Begin these 2 days following surgery. . Walk with your walker as instructed. Use the walker until you are comfortable transitioning to a cane. Walk with the cane in the opposite hand of the operative leg. You may discontinue the cane once you are comfortable and walking steadily. . Avoid periods of inactivity such as sitting longer than an hour when not asleep. This helps prevent blood clots.  . Do not drive a car for 6 weeks or until released by your surgeon.  . Do not drive while taking narcotics.  TED HOSE STOCKINGS Wear the elastic stockings on both legs for three weeks following surgery during the day. You may remove them at night while sleeping.  WEIGHT BEARING Weight bearing as tolerated with assist device (walker, cane, etc) as directed, use it as long as suggested by your surgeon or therapist, typically at least 4-6 weeks.  POSTOPERATIVE CONSTIPATION PROTOCOL Constipation -   defined medically as fewer than three stools per week and severe constipation as less than one stool per week.  One of the most common issues patients have following surgery is constipation.  Even if you have a regular bowel pattern at home, your normal regimen is likely to be disrupted due to multiple reasons following surgery.  Combination of anesthesia, postoperative narcotics, change in  appetite and fluid intake all can affect your bowels.  In order to avoid complications following surgery, here are some recommendations in order to help you during your recovery period.  . Colace (docusate) - Pick up an over-the-counter form of Colace or another stool softener and take twice a day as long as you are requiring postoperative pain medications.  Take with a full glass of water daily.  If you experience loose stools or diarrhea, hold the colace until you stool forms back up.  If your symptoms do not get better within 1 week or if they get worse, check with your doctor. . Dulcolax (bisacodyl) - Pick up over-the-counter and take as directed by the product packaging as needed to assist with the movement of your bowels.  Take with a full glass of water.  Use this product as needed if not relieved by Colace only.  . MiraLax (polyethylene glycol) - Pick up over-the-counter to have on hand.  MiraLax is a solution that will increase the amount of water in your bowels to assist with bowel movements.  Take as directed and can mix with a glass of water, juice, soda, coffee, or tea.  Take if you go more than two days without a movement.Do not use MiraLax more than once per day. Call your doctor if you are still constipated or irregular after using this medication for 7 days in a row.  If you continue to have problems with postoperative constipation, please contact the office for further assistance and recommendations.  If you experience "the worst abdominal pain ever" or develop nausea or vomiting, please contact the office immediatly for further recommendations for treatment.  ITCHING  If you experience itching with your medications, try taking only a single pain pill, or even half a pain pill at a time.  You can also use Benadryl over the counter for itching or also to help with sleep.   MEDICATIONS See your medication summary on the "After Visit Summary" that the nursing staff will review with you  prior to discharge.  You may have some home medications which will be placed on hold until you complete the course of blood thinner medication.  It is important for you to complete the blood thinner medication as prescribed by your surgeon.  Continue your approved medications as instructed at time of discharge.  PRECAUTIONS If you experience chest pain or shortness of breath - call 911 immediately for transfer to the hospital emergency department.  If you develop a fever greater that 101 F, purulent drainage from wound, increased redness or drainage from wound, foul odor from the wound/dressing, or calf pain - CONTACT YOUR SURGEON.                                                   FOLLOW-UP APPOINTMENTS Make sure you keep all of your appointments after your operation with your surgeon and caregivers. You should call the office at the above phone number and   make an appointment for approximately two weeks after the date of your surgery or on the date instructed by your surgeon outlined in the "After Visit Summary".  RANGE OF MOTION AND STRENGTHENING EXERCISES  These exercises are designed to help you keep full movement of your hip joint. Follow your caregiver's or physical therapist's instructions. Perform all exercises about fifteen times, three times per day or as directed. Exercise both hips, even if you have had only one joint replacement. These exercises can be done on a training (exercise) mat, on the floor, on a table or on a bed. Use whatever works the best and is most comfortable for you. Use music or television while you are exercising so that the exercises are a pleasant break in your day. This will make your life better with the exercises acting as a break in routine you can look forward to.  . Lying on your back, slowly slide your foot toward your buttocks, raising your knee up off the floor. Then slowly slide your foot back down until your leg is straight again.  . Lying on your back spread  your legs as far apart as you can without causing discomfort.  . Lying on your side, raise your upper leg and foot straight up from the floor as far as is comfortable. Slowly lower the leg and repeat.  . Lying on your back, tighten up the muscle in the front of your thigh (quadriceps muscles). You can do this by keeping your leg straight and trying to raise your heel off the floor. This helps strengthen the largest muscle supporting your knee.  . Lying on your back, tighten up the muscles of your buttocks both with the legs straight and with the knee bent at a comfortable angle while keeping your heel on the floor.   IF YOU ARE TRANSFERRED TO A SKILLED REHAB FACILITY If the patient is transferred to a skilled rehab facility following release from the hospital, a list of the current medications will be sent to the facility for the patient to continue.  When discharged from the skilled rehab facility, please have the facility set up the patient's Home Health Physical Therapy prior to being released. Also, the skilled facility will be responsible for providing the patient with their medications at time of release from the facility to include their pain medication, the muscle relaxants, and their blood thinner medication. If the patient is still at the rehab facility at time of the two week follow up appointment, the skilled rehab facility will also need to assist the patient in arranging follow up appointment in our office and any transportation needs.  MAKE SURE YOU:  . Understand these instructions.  . Get help right away if you are not doing well or get worse.    DENTAL ANTIBIOTICS:  In most cases prophylactic antibiotics for Dental procdeures after total joint surgery are not necessary.  Exceptions are as follows:  1. History of prior total joint infection  2. Severely immunocompromised (Organ Transplant, cancer chemotherapy, Rheumatoid biologic meds such as Humera)  3. Poorly controlled  diabetes (A1C &gt; 8.0, blood glucose over 200)  If you have one of these conditions, contact your surgeon for an antibiotic prescription, prior to your dental procedure.    Pick up stool softner and laxative for home use following surgery while on pain medications. Do not submerge incision under water. Please use good hand washing techniques while changing dressing each day. May shower starting three days after surgery. Please   use a clean towel to pat the incision dry following showers. Continue to use ice for pain and swelling after surgery. Do not use any lotions or creams on the incision until instructed by your surgeon.  

## 2019-12-22 NOTE — Anesthesia Postprocedure Evaluation (Signed)
Anesthesia Post Note  Patient: Carla Ali  Procedure(s) Performed: TOTAL HIP ARTHROPLASTY ANTERIOR APPROACH (Right Hip)     Patient location during evaluation: PACU Anesthesia Type: General Level of consciousness: awake and alert Pain management: pain level controlled Vital Signs Assessment: post-procedure vital signs reviewed and stable Respiratory status: spontaneous breathing, nonlabored ventilation, respiratory function stable and patient connected to nasal cannula oxygen Cardiovascular status: blood pressure returned to baseline and stable Postop Assessment: no apparent nausea or vomiting Anesthetic complications: no   No complications documented.  Last Vitals:  Vitals:   12/22/19 1345 12/22/19 1400  BP: 121/73 133/81  Pulse: (!) 54 (!) 54  Resp: 11 14  Temp:    SpO2: 100% 100%    Last Pain:  Vitals:   12/22/19 1400  TempSrc:   PainSc: 5                  Kyelle Urbas

## 2019-12-22 NOTE — Anesthesia Preprocedure Evaluation (Addendum)
Anesthesia Evaluation  Patient identified by MRN, date of birth, ID band Patient awake    Reviewed: Allergy & Precautions, H&P , NPO status , Patient's Chart, lab work & pertinent test results, reviewed documented beta blocker date and time   Airway Mallampati: I  TM Distance: >3 FB Neck ROM: full    Dental no notable dental hx. (+) Teeth Intact, Dental Advisory Given   Pulmonary neg pulmonary ROS, former smoker,    Pulmonary exam normal breath sounds clear to auscultation       Cardiovascular Exercise Tolerance: Good hypertension,  Rhythm:regular Rate:Normal     Neuro/Psych negative neurological ROS  negative psych ROS   GI/Hepatic negative GI ROS, Neg liver ROS,   Endo/Other  Hypothyroidism   Renal/GU negative Renal ROS  negative genitourinary   Musculoskeletal   Abdominal   Peds  Hematology negative hematology ROS (+)   Anesthesia Other Findings   Reproductive/Obstetrics negative OB ROS                            Anesthesia Physical Anesthesia Plan  ASA: II  Anesthesia Plan: General   Post-op Pain Management:    Induction:   PONV Risk Score and Plan: 3 and Ondansetron, Dexamethasone and Treatment may vary due to age or medical condition  Airway Management Planned: Nasal Cannula, Simple Face Mask and Natural Airway  Additional Equipment:   Intra-op Plan:   Post-operative Plan:   Informed Consent: I have reviewed the patients History and Physical, chart, labs and discussed the procedure including the risks, benefits and alternatives for the proposed anesthesia with the patient or authorized representative who has indicated his/her understanding and acceptance.     Dental Advisory Given  Plan Discussed with: CRNA and Anesthesiologist  Anesthesia Plan Comments:         Anesthesia Quick Evaluation

## 2019-12-22 NOTE — Progress Notes (Signed)
PT Cancellation Note  Patient Details Name: Carla Ali MRN: [id number] DOB: 1960-12-09   Cancelled Treatment:      Entered and patient in tears with pain setting in. Educated on possibilities of moving, or repositioning tonight with nursing to see if that helps. We did reposition her in bed to see if the that helped at all. Pt very motivated and wants to still shoot for going home tomorrow if all is good.   Marella Bile 12/22/2019, 5:08 PM  Clois Dupes, PT, MPT Acute Rehabilitation Services Office: [phone] Pager: [phone] 12/22/2019

## 2019-12-22 NOTE — Plan of Care (Signed)
  Problem: Activity: Goal: Ability to avoid complications of mobility impairment will improve Outcome: Progressing   Problem: Pain Management: Goal: Pain level will decrease with appropriate interventions Outcome: Progressing   Problem: Education: Goal: Knowledge of General Education information will improve Description: Including pain rating scale, medication(s)/side effects and non-pharmacologic comfort measures Outcome: Progressing   Problem: Health Behavior/Discharge Planning: Goal: Ability to manage health-related needs will improve Outcome: Progressing   Problem: Clinical Measurements: Goal: Ability to maintain clinical measurements within normal limits will improve Outcome: Progressing   Problem: Clinical Measurements: Goal: Respiratory complications will improve Outcome: Progressing   Problem: Clinical Measurements: Goal: Cardiovascular complication will be avoided Outcome: Progressing

## 2019-12-22 NOTE — Interval H&P Note (Signed)
History and Physical Interval Note:  12/22/2019 9:07 AM  Carla Ali  has presented today for surgery, with the diagnosis of Right hip osteoarthritis.  The various methods of treatment have been discussed with the patient and family. After consideration of risks, benefits and other options for treatment, the patient has consented to  Procedure(s) with comments: TOTAL HIP ARTHROPLASTY ANTERIOR APPROACH (Right) - as a surgical intervention.  The patient's history has been reviewed, patient examined, no change in status, stable for surgery.  I have reviewed the patient's chart and labs.  Questions were answered to the patient's satisfaction.     Homero Fellers Thomson Herbers

## 2019-12-22 NOTE — Transfer of Care (Signed)
Immediate Anesthesia Transfer of Care Note  Patient: Carla Ali  Procedure(s) Performed: TOTAL HIP ARTHROPLASTY ANTERIOR APPROACH (Right Hip)  Patient Location: PACU  Anesthesia Type:Spinal  Level of Consciousness: awake, alert , oriented and patient cooperative  Airway & Oxygen Therapy: Patient Spontanous Breathing and Patient connected to face mask oxygen  Post-op Assessment: Report given to RN and Post -op Vital signs reviewed and stable  Post vital signs: Reviewed and stable  Last Vitals:  Vitals Value Taken Time  BP 103/62 12/22/19 1304  Temp    Pulse 65 12/22/19 1306  Resp 14 12/22/19 1306  SpO2 99 % 12/22/19 1306  Vitals shown include unvalidated device data.  Last Pain:  Vitals:   12/22/19 0906  TempSrc:   PainSc: 0-No pain         Complications: No complications documented.

## 2019-12-22 NOTE — Op Note (Signed)
OPERATIVE REPORT- TOTAL HIP ARTHROPLASTY   PREOPERATIVE DIAGNOSIS: Osteoarthritis of the Right hip.   POSTOPERATIVE DIAGNOSIS: Osteoarthritis of the Right  hip.   PROCEDURE: Right total hip arthroplasty, anterior approach.   SURGEON: Ollen Gross, MD   ASSISTANT: Dennie Bible, PA-C  ANESTHESIA:  Spinal  ESTIMATED BLOOD LOSS:-400 mL    DRAINS: Hemovac x1.   COMPLICATIONS: None   CONDITION: PACU - hemodynamically stable.   BRIEF CLINICAL NOTE: Carla Ali is a 59 y.o. female who has advanced end-  stage arthritis of their Right  hip with progressively worsening pain and  dysfunction.The patient has failed nonoperative management and presents for  total hip arthroplasty.   PROCEDURE IN DETAIL: After successful administration of spinal  anesthetic, the traction boots for the [facility] bed were placed on both  feet and the patient was placed onto the [facility] bed, boots placed into the leg  holders. The Right hip was then isolated from the perineum with plastic  drapes and prepped and draped in the usual sterile fashion. ASIS and  greater trochanter were marked and a oblique incision was made, starting  at about 1 cm lateral and 2 cm distal to the ASIS and coursing towards  the anterior cortex of the femur. The skin was cut with a 10 blade  through subcutaneous tissue to the level of the fascia overlying the  tensor fascia lata muscle. The fascia was then incised in line with the  incision at the junction of the anterior third and posterior 2/3rd. The  muscle was teased off the fascia and then the interval between the TFL  and the rectus was developed. The Hohmann retractor was then placed at  the top of the femoral neck over the capsule. The vessels overlying the  capsule were cauterized and the fat on top of the capsule was removed.  A Hohmann retractor was then placed anterior underneath the rectus  femoris to give exposure to the entire anterior capsule. A T-shaped   capsulotomy was performed. The edges were tagged and the femoral head  was identified.       Osteophytes are removed off the superior acetabulum.  The femoral neck was then cut in situ with an oscillating saw. Traction  was then applied to the left lower extremity utilizing the [facility]  traction. The femoral head was then removed. Retractors were placed  around the acetabulum and then circumferential removal of the labrum was  performed. Osteophytes were also removed. Reaming starts at 45 mm to  medialize and  Increased in 2 mm increments to 47 mm. We reamed in  approximately 40 degrees of abduction, 20 degrees anteversion. A 48 mm  pinnacle acetabular shell was then impacted in anatomic position under  fluoroscopic guidance with excellent purchase. We did not need to place  any additional dome screws. A 28 mm neutral + 4 marathon liner was then  placed into the acetabular shell.       The femoral lift was then placed along the lateral aspect of the femur  just distal to the vastus ridge. The leg was  externally rotated and capsule  was stripped off the inferior aspect of the femoral neck down to the  level of the lesser trochanter, this was done with electrocautery. The femur was lifted after this was performed. The  leg was then placed in an extended and adducted position essentially delivering the femur. We also removed the capsule superiorly and the piriformis from the piriformis  fossa to gain excellent exposure of the  proximal femur. Rongeur was used to remove some cancellous bone to get  into the lateral portion of the proximal femur for placement of the  initial starter reamer. The starter broaches was placed  the starter broach  and was shown to go down the center of the canal. Broaching  with the Actis system was then performed starting at size 0  coursing  Up to size 3. A size 3 had excellent torsional and rotational  and axial stability. The trial high offset neck was then placed   with a 28 + 5 trial head. The hip was then reduced. We confirmed that  the stem was in the canal both on AP and lateral x-rays. It also has excellent sizing. The hip was reduced with outstanding stability through full extension and full external rotation.. AP pelvis was taken and the leg lengths were measured and found to be equal. Hip was then dislocated again and the femoral head and neck removed. The  femoral broach was removed. Size 3 Actis stem with a high offset  neck was then impacted into the femur following native anteversion. Has  excellent purchase in the canal. Excellent torsional and rotational and  axial stability. It is confirmed to be in the canal on AP and lateral  fluoroscopic views. The 28 + 5 ceramic head was placed and the hip  reduced with outstanding stability. Again AP pelvis was taken and it  confirmed that the leg lengths were equal. The wound was then copiously  irrigated with saline solution and the capsule reattached and repaired  with Ethibond suture. 30 ml of .25% Bupivicaine was  injected into the capsule and into the edge of the tensor fascia lata as well as subcutaneous tissue. The fascia overlying the tensor fascia lata was then closed with a running #1 V-Loc. Subcu was closed with interrupted 2-0 Vicryl and subcuticular running 4-0 Monocryl. Incision was cleaned  and dried. Steri-Strips and a bulky sterile dressing applied. Hemovac  drain was hooked to suction and then the patient was awakened and transported to  recovery in stable condition.        Please note that a surgical assistant was a medical necessity for this procedure to perform it in a safe and expeditious manner. Assistant was necessary to provide appropriate retraction of vital neurovascular structures and to prevent femoral fracture and allow for anatomic placement of the prosthesis.  Gaynelle Arabian, M.D.

## 2019-12-22 NOTE — Anesthesia Procedure Notes (Signed)
Spinal  Patient location during procedure: OR Start time: 12/22/2019 11:18 AM End time: 12/22/2019 11:24 AM Staffing Performed: anesthesiologist  Anesthesiologist: Bethena Midget, MD Resident/CRNA: Yolonda Kida, CRNA Preanesthetic Checklist Completed: patient identified, IV checked, site marked, risks and benefits discussed, surgical consent, monitors and equipment checked, pre-op evaluation and timeout performed Spinal Block Patient position: sitting Prep: DuraPrep Patient monitoring: blood pressure, continuous pulse ox and heart rate Approach: midline Location: L3-4 Injection technique: single-shot Needle Needle type: Pencan  Needle gauge: 24 G Assessment Sensory level: T6

## 2019-12-23 DIAGNOSIS — M1611 Unilateral primary osteoarthritis, right hip: Secondary | ICD-10-CM | POA: Diagnosis not present

## 2019-12-23 LAB — BASIC METABOLIC PANEL
Anion gap: 10 (ref 5–15)
BUN: 17 mg/dL (ref 6–20)
CO2: 24 mmol/L (ref 22–32)
Calcium: 8.5 mg/dL — ABNORMAL LOW (ref 8.9–10.3)
Chloride: 101 mmol/L (ref 98–111)
Creatinine, Ser: 0.91 mg/dL (ref 0.44–1.00)
GFR, Estimated: >60 mL/min (ref >60)
Glucose, Bld: 144 mg/dL — ABNORMAL HIGH (ref 70–99)
Potassium: 4.1 mmol/L (ref 3.5–5.1)
Sodium: 135 mmol/L (ref 135–145)

## 2019-12-23 LAB — CBC
HCT: 27.8 % — ABNORMAL LOW (ref 36.0–46.0)
Hemoglobin: 9.2 g/dL — ABNORMAL LOW (ref 12.0–15.0)
MCH: 31.8 pg (ref 26.0–34.0)
MCHC: 33.1 g/dL (ref 30.0–36.0)
MCV: 96.2 fL (ref 80.0–100.0)
Platelets: 277 10*3/uL (ref 150–400)
RBC: 2.89 MIL/uL — ABNORMAL LOW (ref 3.87–5.11)
RDW: 11.6 % (ref 11.5–15.5)
WBC: 10.3 10*3/uL (ref 4.0–10.5)
nRBC: 0 % (ref 0.0–0.2)

## 2019-12-23 MED ORDER — GABAPENTIN 300 MG PO CAPS
300.0000 mg | ORAL_CAPSULE | Freq: Three times a day (TID) | ORAL | Status: DC
  Administered 2019-12-23 (×2): 300 mg via ORAL
  Filled 2019-12-23 (×2): qty 1

## 2019-12-23 MED ORDER — GABAPENTIN 300 MG PO CAPS: 300.0000 mg | ORAL_CAPSULE | Freq: Three times a day (TID) | ORAL | Status: DC

## 2019-12-23 NOTE — TOC Transition Note (Signed)
Transition of Care [facility]) - CM/SW Discharge Note   Patient Details  Name: Carla Ali MRN: [id number] Date of Birth: [birth year]  Transition of Care [facility]) CM/SW Contact:  Clearance Coots, LCSW Phone Number: 12/23/2019, 11:24 AM   Clinical Narrative:    Therapy Plan: HEP Patient signed for 3 in1 and RW through [employer] [facility].   Final next level of care: Home/Self Care (HEP) Barriers to Discharge: Barriers Resolved   Patient Goals and CMS Choice        Discharge Placement                       Discharge Plan and Services                DME Arranged: 3-N-1, Walker rolling DME Agency: [employer] Date DME Agency Contacted: 12/23/19 Time DME Agency Contacted: 1123 Representative spoke with at DME Agency: Onsite            Social Determinants of Health (SDOH) Interventions     Readmission Risk Interventions No flowsheet data found.

## 2019-12-23 NOTE — Plan of Care (Signed)
  Problem: Education: Goal: Knowledge of the prescribed therapeutic regimen will improve Outcome: Progressing Goal: Understanding of discharge needs will improve Outcome: Progressing Goal: Individualized Educational Video(s) Outcome: Progressing   Problem: Activity: Goal: Ability to tolerate increased activity will improve Outcome: Progressing   Problem: Elimination: Goal: Will not experience complications related to bowel motility Outcome: Progressing

## 2019-12-23 NOTE — Plan of Care (Signed)
Patient discharged, all care plans met.  

## 2019-12-23 NOTE — Progress Notes (Signed)
Physical Therapy Treatment Patient Details Name: Carla Ali MRN: [id number] DOB: October 23, 1960 Today's Date: 12/23/2019    History of Present Illness Pt s/p R THR    PT Comments    Pt progressing well with mobility.  Spouse present to review car transfers, stairs, lower body dressing and HEP with progression - written instruction provided and reviewed.  Pt eager for dc home.   Follow Up Recommendations  Follow surgeons recommendation for DC plan and follow-up therapies     Equipment Recommendations  Rolling walker with 5" wheels;3in1 (PT)    Recommendations for Other Services       Precautions / Restrictions Precautions Precautions: Fall Restrictions Weight Bearing Restrictions: No Other Position/Activity Restrictions: WBAT    Mobility  Bed Mobility Overal bed mobility: Needs Assistance Bed Mobility: Supine to Sit     Supine to sit: Min guard Sit to supine: Min guard   General bed mobility comments: cues for sequence and use of L LE to self assist  Transfers Overall transfer level: Needs assistance Equipment used: Rolling walker (2 wheeled) Transfers: Sit to/from Stand Sit to Stand: Min guard;Supervision         General transfer comment: cues for LE management and use of UEs to self assist.  Ambulation/Gait Ambulation/Gait assistance: Min guard;Supervision Gait Distance (Feet): 50 Feet Assistive device: Rolling walker (2 wheeled) Gait Pattern/deviations: Step-to pattern;Decreased step length - right;Decreased step length - left;Shuffle;Trunk flexed Gait velocity: decr   General Gait Details: min cues for sequence, posture and position from RW   Stairs Stairs: Yes Stairs assistance: Min assist Stair Management: No rails;Step to pattern;Forwards;With walker Number of Stairs: 4 General stair comments: single step twice fwd and twice bkwd with cues for sequence and foot/RW placement   Wheelchair Mobility    Modified Rankin (Stroke Patients  Only)       Balance Overall balance assessment: Mild deficits observed, not formally tested                                          Cognition Arousal/Alertness: Awake/alert Behavior During Therapy: WFL for tasks assessed/performed Overall Cognitive Status: Within Functional Limits for tasks assessed                                        Exercises Total Joint Exercises Ankle Circles/Pumps: AROM;Both;20 reps;Supine Quad Sets: AROM;Both;10 reps;Supine Heel Slides: AAROM;Right;20 reps;Supine Hip ABduction/ADduction: AAROM;Right;15 reps;Supine Long Arc Quad: Right;10 reps;AAROM;Seated    General Comments        Pertinent Vitals/Pain Pain Assessment: 0-10 Pain Score: 5  Pain Location: R hip Pain Descriptors / Indicators: Aching;Sore Pain Intervention(s): Limited activity within patient's tolerance;Monitored during session;Premedicated before session    Home Living Family/patient expects to be discharged to:: Private residence Living Arrangements: Spouse/significant other Available Help at Discharge: Family Type of Home: House Home Access: Stairs to enter Entrance Stairs-Rails: None Home Layout: One level Home Equipment: None Additional Comments: RW and 3n1 have been delivered to room    Prior Function Level of Independence: Independent          PT Goals (current goals can now be found in the care plan section) Acute Rehab PT Goals Patient Stated Goal: Regain IND PT Goal Formulation: With patient Time For Goal Achievement: 12/30/19 Potential to Achieve Goals: Good Progress  towards PT goals: Progressing toward goals    Frequency    7X/week      PT Plan Current plan remains appropriate    Co-evaluation              AM-PAC PT "6 Clicks" Mobility   Outcome Measure  Help needed turning from your back to your side while in a flat bed without using bedrails?: A Little Help needed moving from lying on your back to  sitting on the side of a flat bed without using bedrails?: A Little Help needed moving to and from a bed to a chair (including a wheelchair)?: A Little Help needed standing up from a chair using your arms (e.g., wheelchair or bedside chair)?: A Little Help needed to walk in hospital room?: A Little Help needed climbing 3-5 steps with a railing? : A Little 6 Click Score: 18    End of Session Equipment Utilized During Treatment: Gait belt Activity Tolerance: Patient tolerated treatment well Patient left: in chair;with call bell/phone within reach;with family/visitor present Nurse Communication: Mobility status PT Visit Diagnosis: Difficulty in walking, not elsewhere classified (R26.2)     Time: 1517-6160 PT Time Calculation (min) (ACUTE ONLY): 24 min  Charges:  $Gait Training: 8-22 mins $Therapeutic Exercise: 8-22 mins $Therapeutic Activity: 8-22 mins                     Carla Ali PT Acute Rehabilitation Services Pager [phone] Office [phone]    Carla Ali 12/23/2019, 2:32 PM

## 2019-12-23 NOTE — Progress Notes (Signed)
    Subjective: 1 Day Post-Op Procedure(s) (LRB): TOTAL HIP ARTHROPLASTY ANTERIOR APPROACH (Right) Patient reports pain as severe last night but she is doing better this AM with adjustment of meds and reposition ing in bed  We will start therapy today.  Plan is to go Home after hospital stay. She would like to go home today if she does well enough with PT  Objective: Vital signs in last 24 hours: Temp:  [97.4 F (36.3 C)-98.7 F (37.1 C)] 98.3 F (36.8 C) (11/25 0511) Pulse Rate:  [52-74] 74 (11/25 0511) Resp:  [11-19] 18 (11/25 0511) BP: (103-158)/(62-90) 156/88 (11/25 0511) SpO2:  [98 %-100 %] 100 % (11/25 0511) Weight:  [88 kg] 88 kg (11/24 0844)  Intake/Output from previous day:  Intake/Output Summary (Last 24 hours) at 12/23/2019 0826 Last data filed at 12/23/2019 0601 Gross per 24 hour  Intake 4337.62 ml  Output 3350 ml  Net 987.62 ml    Intake/Output this shift: No intake/output data recorded.  Labs: Recent Labs    12/20/19 1354 12/23/19 0312  HGB 11.4* 9.2*   Recent Labs    12/20/19 1354 12/23/19 0312  WBC 4.1 10.3  RBC 3.59* 2.89*  HCT 34.7* 27.8*  PLT 305 277   Recent Labs    12/20/19 1354 12/23/19 0312  NA 137 135  K 5.2* 4.1  CL 101 101  CO2 28 24  BUN 15 17  CREATININE 0.79 0.91  GLUCOSE 110* 144*  CALCIUM 9.0 8.5*   Recent Labs    12/20/19 1354  INR 0.9    EXAM General - Patient is Alert, Appropriate and Oriented Extremity - Neurologically intact Neurovascular intact No cellulitis present Compartment soft Dressing - dressing C/D/I Motor Function - intact, moving foot and toes well on exam.   Past Medical History:  Diagnosis Date  . Anxiety   . Arthritis    back and hip  . Depression   . Heart murmur   . Hypertension   . Hypothyroidism     Assessment/Plan: 1 Day Post-Op Procedure(s) (LRB): TOTAL HIP ARTHROPLASTY ANTERIOR APPROACH (Right) Principal Problem:   OA (osteoarthritis) of hip Active Problems:   Primary  osteoarthritis of right hip   Advance diet Up with therapy D/C IV fluids  Discharge home if pain controlled enough and if she does well enough with PT DVT Prophylaxis - Aspirin Weight Bearing As Tolerated right Leg   Ollen Gross

## 2019-12-23 NOTE — Evaluation (Signed)
Physical Therapy Evaluation Patient Details Name: Carla Ali MRN: [id number] DOB: [birth year] Today's Date: 12/23/2019   History of Present Illness  Pt s/p R THR  Clinical Impression  Pt s/p R THR and presents with decreased R LE strength/ROM and post op pain limiting functional mobility.  Pt should progress to dc home with family assist.    Follow Up Recommendations Follow surgeon's recommendation for DC plan and follow-up therapies    Equipment Recommendations  Rolling walker with 5" wheels;3in1 (PT) (have been delivered)    Recommendations for Other Services       Precautions / Restrictions Precautions Precautions: Fall Restrictions Weight Bearing Restrictions: No Other Position/Activity Restrictions: WBAT      Mobility  Bed Mobility Overal bed mobility: Needs Assistance Bed Mobility: Sit to Supine       Sit to supine: Min guard   General bed mobility comments: cues for sequence and use of L LE to self assist    Transfers Overall transfer level: Needs assistance Equipment used: Rolling walker (2 wheeled) Transfers: Sit to/from Stand Sit to Stand: Min guard         General transfer comment: cues for LE management and use of UEs to self assist.  Ambulation/Gait Ambulation/Gait assistance: Min guard;Supervision Gait Distance (Feet): 100 Feet Assistive device: Rolling walker (2 wheeled) Gait Pattern/deviations: Step-to pattern;Decreased step length - right;Decreased step length - left;Shuffle;Trunk flexed Gait velocity: decr   General Gait Details: cues for sequence, posture and position from [employer]            Wheelchair Mobility    Modified Rankin (Stroke Patients Only)       Balance Overall balance assessment: Mild deficits observed, not formally tested                                           Pertinent Vitals/Pain Pain Assessment: 0-10 Pain Score: 5  Pain Location: R hip Pain Descriptors / Indicators:  Aching;Sore Pain Intervention(s): Limited activity within patient's tolerance;Monitored during session;Premedicated before session    Home Living Family/patient expects to be discharged to:: Private residence Living Arrangements: Spouse/significant other Available Help at Discharge: Family Type of Home: House Home Access: Stairs to enter Entrance Stairs-Rails: None Secretary/administrator of Steps: 1 Home Layout: One level Home Equipment: None Additional Comments: RW and 3n1 have been delivered to room    Prior Function Level of Independence: Independent               Hand Dominance        Extremity/Trunk Assessment   Upper Extremity Assessment Upper Extremity Assessment: Overall WFL for tasks assessed    Lower Extremity Assessment Lower Extremity Assessment: RLE deficits/detail RLE Deficits / Details: Strength at hip 2/5 with AAROM at hip to 80 flex and 15 abd    Cervical / Trunk Assessment Cervical / Trunk Assessment: Normal  Communication   Communication: No difficulties  Cognition Arousal/Alertness: Awake/alert Behavior During Therapy: WFL for tasks assessed/performed Overall Cognitive Status: Within Functional Limits for tasks assessed                                        General Comments      Exercises Total Joint Exercises Ankle Circles/Pumps: AROM;Both;20 reps;Supine Quad Sets: AROM;Both;10 reps;Supine Heel Slides: AAROM;Right;20 reps;Supine  Hip ABduction/ADduction: AAROM;Right;15 reps;Supine Long Arc Quad: Right;10 reps;AAROM;Seated   Assessment/Plan    PT Assessment Patient needs continued PT services  PT Problem List Decreased strength;Decreased range of motion;Decreased activity tolerance;Decreased balance;Decreased mobility;Decreased knowledge of use of DME;Pain       PT Treatment Interventions DME instruction;Gait training;Stair training;Functional mobility training;Therapeutic activities;Therapeutic  exercise;Patient/family education    PT Goals (Current goals can be found in the Care Plan section)  Acute Rehab PT Goals Patient Stated Goal: Regain IND PT Goal Formulation: With patient Time For Goal Achievement: 12/30/19 Potential to Achieve Goals: Good    Frequency 7X/week   Barriers to discharge        Co-evaluation               AM-PAC PT "6 Clicks" Mobility  Outcome Measure Help needed turning from your back to your side while in a flat bed without using bedrails?: A Little Help needed moving from lying on your back to sitting on the side of a flat bed without using bedrails?: A Little Help needed moving to and from a bed to a chair (including a wheelchair)?: A Little Help needed standing up from a chair using your arms (e.g., wheelchair or bedside chair)?: A Little Help needed to walk in hospital room?: A Little Help needed climbing 3-5 steps with a railing? : A Little 6 Click Score: 18    End of Session Equipment Utilized During Treatment: Gait belt Activity Tolerance: Patient tolerated treatment well Patient left: in bed;with family/visitor present;with call bell/phone within reach Nurse Communication: Mobility status PT Visit Diagnosis: Difficulty in walking, not elsewhere classified (R26.2)    Time: 1829-9371 PT Time Calculation (min) (ACUTE ONLY): 28 min   Charges:   PT Evaluation $PT Eval Low Complexity: 1 Low PT Treatments $Therapeutic Exercise: 8-22 mins        Mauro Kaufmann PT Acute Rehabilitation Services Pager [phone] Office [phone]   Shalina Norfolk 12/23/2019, 2:24 PM

## 2019-12-23 NOTE — Progress Notes (Signed)
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 Pt states it radiates down her leg at times. Pt in tears and crying. Awaiting call back.

## 2019-12-27 ENCOUNTER — Encounter (HOSPITAL_COMMUNITY): Payer: Self-pay | Admitting: Orthopedic Surgery

## 2019-12-27 NOTE — Discharge Summary (Signed)
Physician Discharge Summary   Patient ID: Carla Ali MRN: [id number] DOB/AGE: [birth year] 57 y.o.  Admit date: 12/22/2019 Discharge date: 12/23/2019  Primary Diagnosis: Osteoarthritis, right hip   Admission Diagnoses:  Past Medical History:  Diagnosis Date   Anxiety    Arthritis    back and hip   Depression    Heart murmur    Hypertension    Hypothyroidism    Discharge Diagnoses:   Principal Problem:   OA (osteoarthritis) of hip Active Problems:   Primary osteoarthritis of right hip  Estimated body mass index is 30.84 kg/m as calculated from the following:   Height as of this encounter: 5' 6.5" (1.689 m).   Weight as of this encounter: 88 kg.  Procedure:  Procedure(s) (LRB): TOTAL HIP ARTHROPLASTY ANTERIOR APPROACH (Right)   Consults: None  HPI: Carla Ali is a 59 y.o. female who has advanced end-stage arthritis of their Right  hip with progressively worsening pain and dysfunction.The patient has failed nonoperative management and presents for total hip arthroplasty.   Laboratory Data: Admission on 12/22/2019, Discharged on 12/23/2019  Component Date Value Ref Range Status   ABO/RH(D) 12/22/2019    Final                   Value:O POS Performed at [facility], [address] [place], [place] [address]    WBC 12/23/2019 10.3  4.0 - 10.5 K/uL Final   RBC 12/23/2019 2.89* 3.87 - 5.11 MIL/uL Final   Hemoglobin 12/23/2019 9.2* 12.0 - 15.0 g/dL Final   HCT 08/65/7846 27.8* 36 - 46 % Final   MCV 12/23/2019 96.2  80.0 - 100.0 fL Final   MCH 12/23/2019 31.8  26.0 - 34.0 pg Final   MCHC 12/23/2019 33.1  30.0 - 36.0 g/dL Final   RDW 96/29/5284 11.6  11.5 - 15.5 % Final   Platelets 12/23/2019 277  150 - 400 K/uL Final   nRBC 12/23/2019 0.0  0.0 - 0.2 % Final   Performed at [facility], [address], [place], [place] [address]   Sodium 12/23/2019 135  135 - 145 mmol/L Final   Potassium 12/23/2019  4.1  3.5 - 5.1 mmol/L Final   Chloride 12/23/2019 101  98 - 111 mmol/L Final   CO2 12/23/2019 24  22 - 32 mmol/L Final   Glucose, Bld 12/23/2019 144* 70 - 99 mg/dL Final   Glucose reference range applies only to samples taken after fasting for at least 8 hours.   BUN 12/23/2019 17  6 - 20 mg/dL Final   Creatinine, Ser 12/23/2019 0.91  0.44 - 1.00 mg/dL Final   Calcium 01/30/7251 8.5* 8.9 - 10.3 mg/dL Final   GFR, Estimated 12/23/2019 >60  >60 mL/min Final   Comment: (NOTE) Calculated using the CKD-EPI Creatinine Equation (2021)    Anion gap 12/23/2019 10  5 - 15 Final   Performed at [facility], [address], [place], [place] [address]  Hospital Outpatient Visit on 12/20/2019  Component Date Value Ref Range Status   SARS Coronavirus 2 12/20/2019 NEGATIVE  NEGATIVE Final   Comment: (NOTE) SARS-CoV-2 target nucleic acids are NOT DETECTED.  The SARS-CoV-2 RNA is generally detectable in upper and lower respiratory specimens during the acute phase of infection. Negative results do not preclude SARS-CoV-2 infection, do not rule out co-infections with other pathogens, and should not be used as the sole basis for treatment or other patient management decisions. Negative results must  be combined with clinical observations, patient history, and epidemiological information. The expected result is Negative.  Fact Sheet for Patients: HairSlick.no  Fact Sheet for Healthcare Providers: quierodirigir.com  This test is not yet approved or cleared by the [place] FDA and  has been authorized for detection and/or diagnosis of SARS-CoV-2 by FDA under an Emergency Use Authorization (EUA). This EUA will remain  in effect (meaning this test can be used) for the duration of the COVID-19 declaration under Se                          ction 564(b)(1) of the Act, 21 U.S.C. section 360bbb-3(b)(1), unless the  authorization is terminated or revoked sooner.  Performed at [facility], [address] [place], [place] [address]   Hospital Outpatient Visit on 12/20/2019  Component Date Value Ref Range Status   aPTT 12/20/2019 32  24 - 36 seconds Final   Performed at [facility], [address], [place], [place] [address]   WBC 12/20/2019 4.1  4.0 - 10.5 K/uL Final   RBC 12/20/2019 3.59* 3.87 - 5.11 MIL/uL Final   Hemoglobin 12/20/2019 11.4* 12.0 - 15.0 g/dL Final   HCT 29/51/8841 34.7* 36 - 46 % Final   MCV 12/20/2019 96.7  80.0 - 100.0 fL Final   MCH 12/20/2019 31.8  26.0 - 34.0 pg Final   MCHC 12/20/2019 32.9  30.0 - 36.0 g/dL Final   RDW 66/06/3014 11.5  11.5 - 15.5 % Final   Platelets 12/20/2019 305  150 - 400 K/uL Final   nRBC 12/20/2019 0.0  0.0 - 0.2 % Final   Performed at [facility], [address], [place], [place] [address]   Sodium 12/20/2019 137  135 - 145 mmol/L Final   Potassium 12/20/2019 5.2* 3.5 - 5.1 mmol/L Final   Chloride 12/20/2019 101  98 - 111 mmol/L Final   CO2 12/20/2019 28  22 - 32 mmol/L Final   Glucose, Bld 12/20/2019 110* 70 - 99 mg/dL Final   Glucose reference range applies only to samples taken after fasting for at least 8 hours.   BUN 12/20/2019 15  6 - 20 mg/dL Final   Creatinine, Ser 12/20/2019 0.79  0.44 - 1.00 mg/dL Final   Calcium 23/55/7322 9.0  8.9 - 10.3 mg/dL Final   Total Protein 02/54/2706 7.1  6.5 - 8.1 g/dL Final   Albumin 23/76/2831 4.2  3.5 - 5.0 g/dL Final   AST 51/76/1607 20  15 - 41 U/L Final   ALT 12/20/2019 16  0 - 44 U/L Final   Alkaline Phosphatase 12/20/2019 88  38 - 126 U/L Final   Total Bilirubin 12/20/2019 0.4  0.3 - 1.2 mg/dL Final   GFR, Estimated 12/20/2019 >60  >60 mL/min Final   Comment: (NOTE) Calculated using the CKD-EPI Creatinine Equation (2021)    Anion gap 12/20/2019 8  5 - 15 Final   Performed at [facility], [address], [place], [place] [address]   Prothrombin Time 12/20/2019 12.0  11.4 - 15.2 seconds Final   INR 12/20/2019 0.9  0.8 - 1.2 Final   Comment: (NOTE) INR goal varies based on device and disease states. Performed at [facility], [address] [place], [place] [address]    ABO/RH(D) 12/20/2019 O POS   Final   Antibody Screen 12/20/2019 NEG   Final   Sample Expiration 12/20/2019 12/25/2019,2359  Final   Extend sample reason 12/20/2019    Final                   Value:NO TRANSFUSIONS OR PREGNANCY IN THE PAST 3 MONTHS Performed at [facility], [address], [place], [place] [address]    MRSA, PCR 12/20/2019 NEGATIVE  NEGATIVE Final   Staphylococcus aureus 12/20/2019 POSITIVE* NEGATIVE Final   Comment: (NOTE) The Xpert SA Assay (FDA approved for NASAL specimens in patients 27 years of age and older), is one component of a comprehensive surveillance program. It is not intended to diagnose infection nor to guide or monitor treatment. Performed at [facility], [address], [place], [place] [address]      X-Rays:DG Pelvis Portable  Result Date: 12/22/2019 CLINICAL DATA:  Right hip arthroplasty EXAM: PORTABLE PELVIS 1-2 VIEWS COMPARISON:  None. FINDINGS: Postsurgical changes from right total hip arthroplasty. Arthroplasty components are in their expected alignment without evidence of a periprosthetic fracture. Pelvic bony ring intact. Expected postoperative changes within the soft tissues overlying the proximal right femur. IMPRESSION: Satisfactory postoperative appearance status post right total hip arthroplasty. Electronically Signed   By: Duanne Guess D.O.   On: 12/22/2019 13:30   DG C-Arm 1-60 Min-No Report  Result Date: 12/22/2019 Fluoroscopy was utilized by the requesting physician.  No radiographic interpretation.   DG HIP OPERATIVE UNILAT W OR W/O PELVIS RIGHT  Result Date: 12/22/2019 CLINICAL  DATA:  59 year old female undergoing right hip surgery. EXAM: OPERATIVE RIGHT HIP (WITH PELVIS IF PERFORMED) 6 VIEWS TECHNIQUE: Fluoroscopic spot image(s) were submitted for interpretation post-operatively. COMPARISON:  None. FINDINGS: Multiple intraoperative fluoroscopic AP spot views of the right hip and lower pelvis. Hip joint degeneration noted on the initial views. Subsequent images demonstrate placement of right hip bipolar arthroplasty. Postoperative drain demonstrated on the final image. Normal AP alignment of the hardware which appears intact. FLUOROSCOPY TIME:  0 minutes 9 seconds IMPRESSION: Intraoperative images of bipolar right hip arthroplasty with no adverse features. Electronically Signed   By: Odessa Fleming M.D.   On: 12/22/2019 12:47    EKG:No orders found for this or any previous visit.   Hospital Course: Carla Ali is a 59 y.o. who was admitted to [facility]. They were brought to the operating room on 12/22/2019 and underwent Procedure(s): TOTAL HIP ARTHROPLASTY ANTERIOR APPROACH.  Patient tolerated the procedure well and was later transferred to the recovery room and then to the orthopaedic floor for postoperative care. They were given PO and IV analgesics for pain control following their surgery. They were given 24 hours of postoperative antibiotics of  Anti-infectives (From admission, onward)   Start     Dose/Rate Route Frequency Ordered Stop   12/22/19 1730  ceFAZolin (ANCEF) IVPB 2g/100 mL premix        2 g 200 mL/hr over 30 Minutes Intravenous Every 6 hours 12/22/19 1407 12/22/19 2340   12/22/19 0845  ceFAZolin (ANCEF) IVPB 2g/100 mL premix        2 g 200 mL/hr over 30 Minutes Intravenous On call to O.R. 12/22/19 5852 12/22/19 1127     and started on DVT prophylaxis in the form of Aspirin.   PT and OT were ordered for total joint protocol. Discharge planning consulted to help with postop disposition and equipment needs.  Patient had a decent night on the evening  of surgery. Had increased pain levels that were improved with medication changes. They started to get up OOB  with therapy on POD #0. Pt was seen during rounds and was ready to go home pending progress with therapy. She worked with therapy on POD #1 and was meeting her goals. Pt was discharged to home later that day in stable condition.  Diet: Regular diet Activity: WBAT Follow-up: in 2 weeks Disposition: Home with HEP Discharged Condition: stable   Discharge Instructions    Call MD / Call 911   Complete by: As directed    If you experience chest pain or shortness of breath, CALL 911 and be transported to the hospital emergency room.  If you develope a fever above 101 F, pus (white drainage) or increased drainage or redness at the wound, or calf pain, call your surgeon's office.   Change dressing   Complete by: As directed    You have an adhesive waterproof bandage over the incision. Leave this in place until your first follow-up appointment. Once you remove this you will not need to place another bandage.   Constipation Prevention   Complete by: As directed    Drink plenty of fluids.  Prune juice may be helpful.  You may use a stool softener, such as Colace (over the counter) 100 mg twice a day.  Use MiraLax (over the counter) for constipation as needed.   Diet - low sodium heart healthy   Complete by: As directed    Do not sit on low chairs, stoools or toilet seats, as it may be difficult to get up from low surfaces   Complete by: As directed    Driving restrictions   Complete by: As directed    No driving for two weeks   TED hose   Complete by: As directed    Use stockings (TED hose) for three weeks on both leg(s).  You may remove them at night for sleeping.   Weight bearing as tolerated   Complete by: As directed      Allergies as of 12/23/2019      Reactions   Irbesartan Hives      Medication List    STOP taking these medications   meloxicam 7.5 MG tablet Commonly known  as: MOBIC     TAKE these medications   amLODipine 5 MG tablet Commonly known as: NORVASC Take 5 mg by mouth at bedtime.   aspirin EC 325 MG tablet Take 1 tablet (325 mg total) by mouth 2 (two) times daily for 21 days. Then take one 81 mg aspirin once a day for three weeks.   levothyroxine 50 MCG tablet Commonly known as: SYNTHROID Take 50 mcg by mouth daily before breakfast.   lisinopril-hydrochlorothiazide 10-12.5 MG tablet Commonly known as: ZESTORETIC Take 1 tablet by mouth in the morning.   methocarbamol 500 MG tablet Commonly known as: Robaxin Take 1 tablet (500 mg total) by mouth every 6 (six) hours as needed for muscle spasms.   oxyCODONE 15 MG immediate release tablet Commonly known as: ROXICODONE Take 15 mg by mouth in the morning, at noon, in the evening, and at bedtime. What changed: Another medication with the same name was added. Make sure you understand how and when to take each.   oxyCODONE 5 MG immediate release tablet Commonly known as: Roxicodone Take 1-2 tablets (5-10 mg total) by mouth every 6 (six) hours as needed (breakthrough pain). What changed: You were already taking a medication with the same name, and this prescription was added. Make sure you understand how and when to take each.   potassium chloride  10 MEQ tablet Commonly known as: KLOR-CON Take 10 mEq by mouth 2 (two) times daily.   venlafaxine 37.5 MG tablet Commonly known as: EFFEXOR Take 37.5 mg by mouth 2 (two) times daily.            Discharge Care Instructions  (From admission, onward)         Start     Ordered   12/22/19 0000  Weight bearing as tolerated        12/22/19 1227   12/22/19 0000  Change dressing       Comments: You have an adhesive waterproof bandage over the incision. Leave this in place until your first follow-up appointment. Once you remove this you will not need to place another bandage.   12/22/19 1227          Follow-up Information    Ollen Gross,  MD. Schedule an appointment as soon as possible for a visit on 01/04/2020.   Specialty: Orthopedic Surgery Contact information: [address] [place] [address] [phone]               Signed: Arther Abbott, PA-C Orthopedic Surgery 12/27/2019, 11:10 AM

## 2021-01-03 ENCOUNTER — Other Ambulatory Visit: Payer: Self-pay | Admitting: Family

## 2021-01-03 DIAGNOSIS — Z1231 Encounter for screening mammogram for malignant neoplasm of breast: Secondary | ICD-10-CM

## 2021-02-07 ENCOUNTER — Ambulatory Visit
Admission: RE | Admit: 2021-02-07 | Discharge: 2021-02-07 | Disposition: A | Discharge status: Home / Self Care | Payer: 59 | Source: Ambulatory Visit | Attending: Family | Admitting: Family

## 2021-02-07 DIAGNOSIS — Z1231 Encounter for screening mammogram for malignant neoplasm of breast: Secondary | ICD-10-CM

## 2021-12-21 IMAGING — RF DG HIP (WITH PELVIS) OPERATIVE*R*
1 series · 6 of 6 positions shown · non-contrast
Comparison: None.

CLINICAL DATA: 59-year-old female undergoing right hip surgery.

EXAM:
OPERATIVE RIGHT HIP (WITH PELVIS IF PERFORMED) 6 VIEWS
TECHNIQUE: Fluoroscopic spot image(s) were submitted for interpretation
post-operatively.

[Series 1: unknown protocol · 0.20mm/px · 6 of 6 slices shown]
[im 1/6]
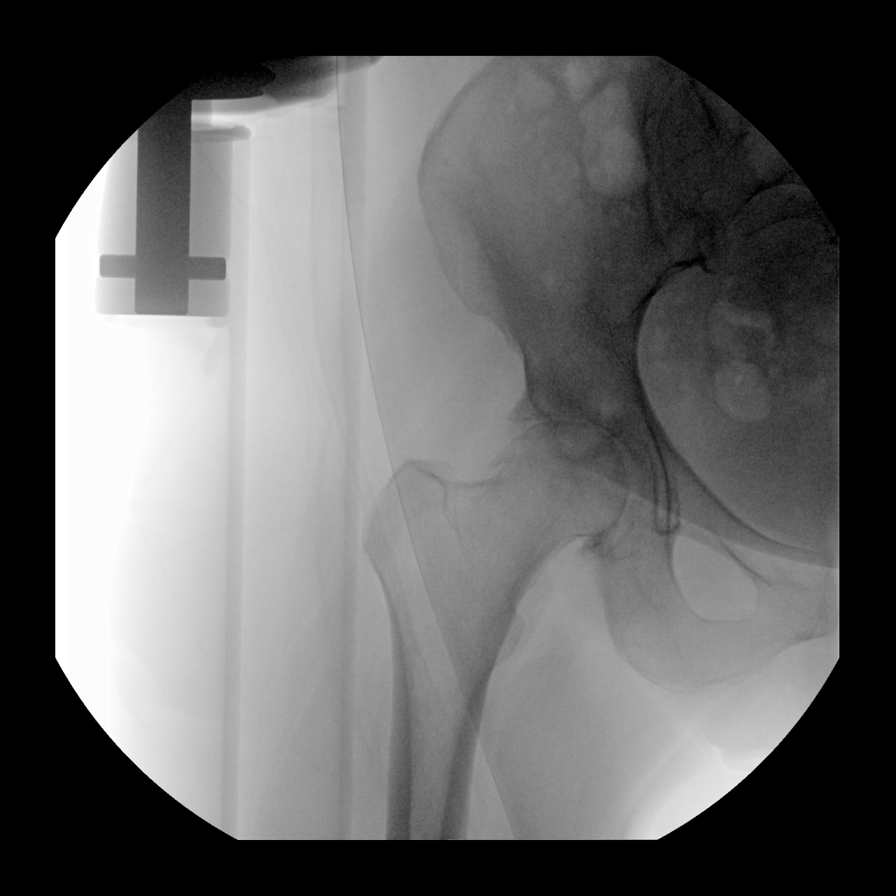
[im 2/6]
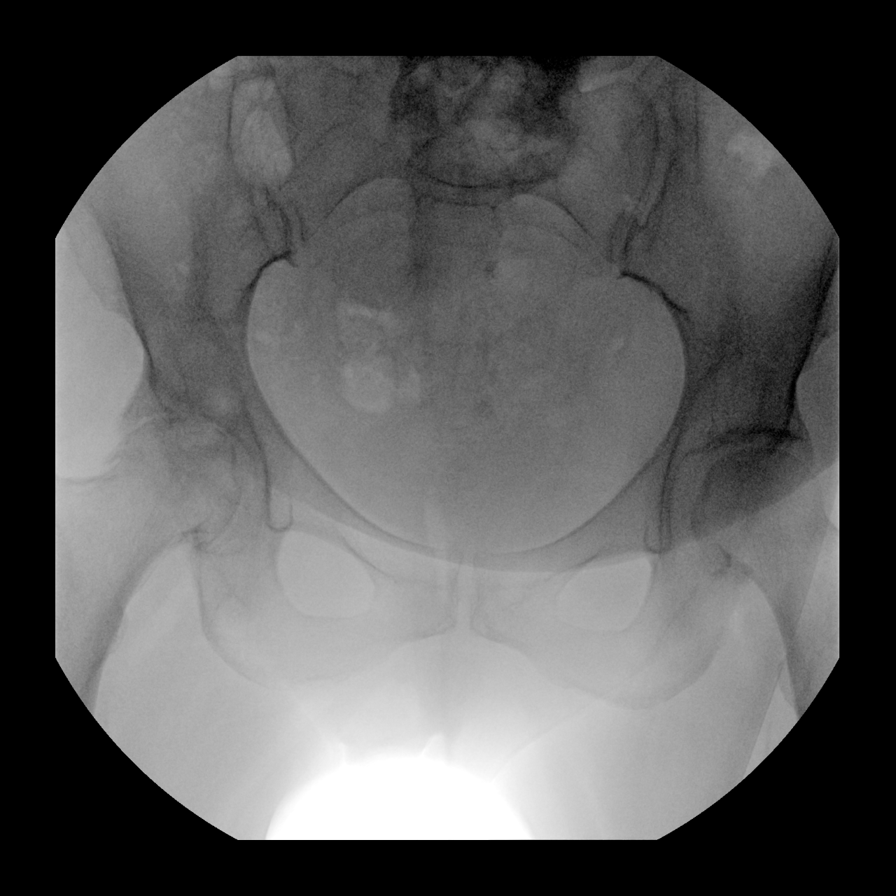
[im 3/6]
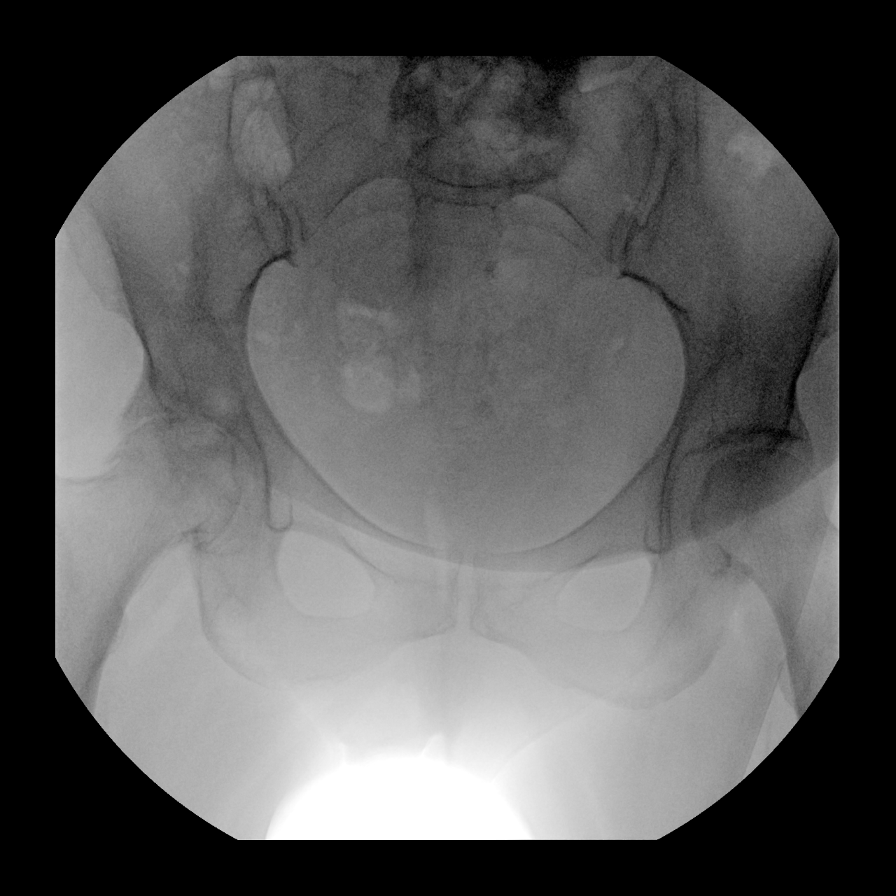
[im 4/6]
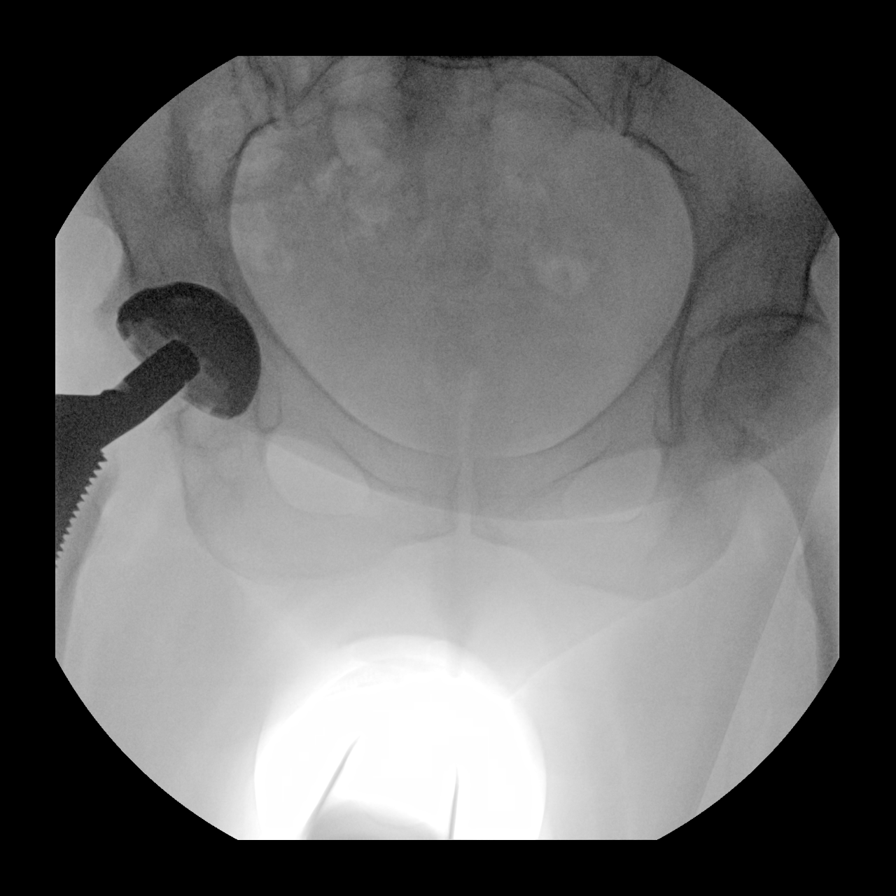
[im 5/6]
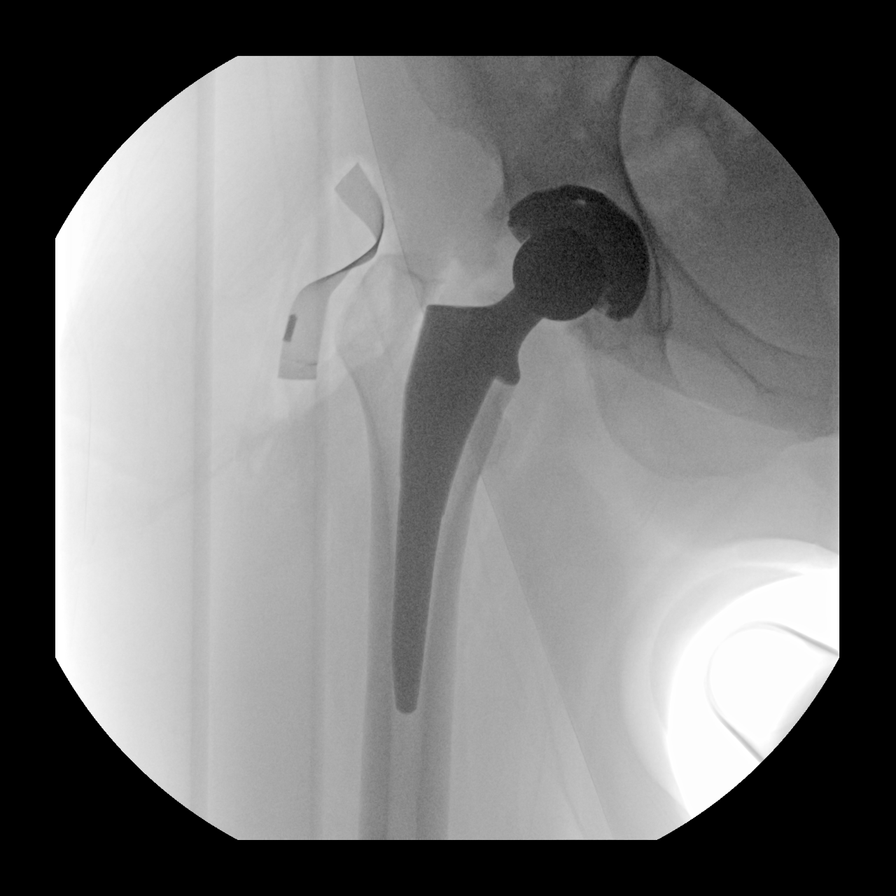
[im 6/6]
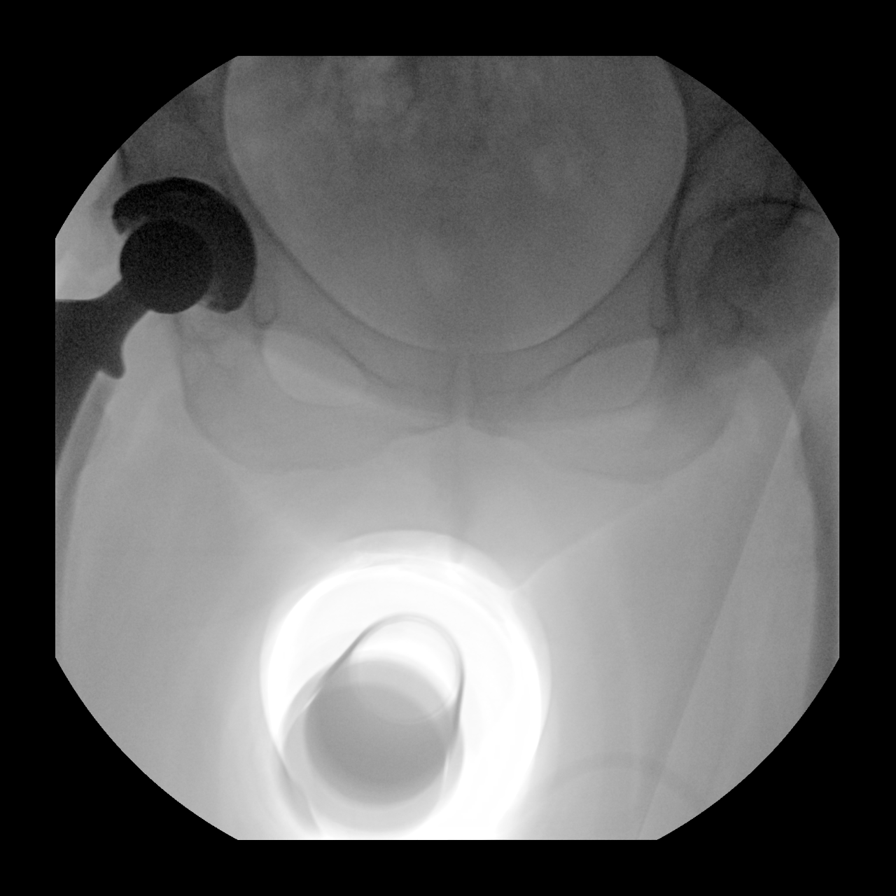

[6 of 6 positions shown; findings below may reference images not displayed]

FINDINGS: Multiple intraoperative fluoroscopic AP spot views of the right hip
and lower pelvis. Hip joint degeneration noted on the initial views.
Subsequent images demonstrate placement of right hip bipolar
arthroplasty. Postoperative drain demonstrated on the final image.
Normal AP alignment of the hardware which appears intact.

FLUOROSCOPY TIME:  0 minutes 9 seconds
IMPRESSION: Intraoperative images of bipolar right hip arthroplasty with no
adverse features.

## 2022-05-28 ENCOUNTER — Ambulatory Visit: Payer: 59 | Admitting: General Surgery

## 2022-10-29 ENCOUNTER — Other Ambulatory Visit: Payer: Self-pay | Admitting: Family

## 2022-10-29 ENCOUNTER — Ambulatory Visit: Payer: 59 | Admitting: General Surgery

## 2022-10-29 DIAGNOSIS — Z1231 Encounter for screening mammogram for malignant neoplasm of breast: Secondary | ICD-10-CM

## 2022-11-12 ENCOUNTER — Ambulatory Visit: Payer: 59 | Admitting: General Surgery

## 2022-11-12 ENCOUNTER — Encounter: Payer: Self-pay | Admitting: General Surgery

## 2022-11-12 VITALS — BP 145/82 | HR 62 | Temp 97.9°F | Resp 16 | Ht 66.5 in | Wt 197.0 lb

## 2022-11-12 DIAGNOSIS — Z1211 Encounter for screening for malignant neoplasm of colon: Secondary | ICD-10-CM

## 2022-11-12 MED ORDER — SUTAB 1479-225-188 MG PO TABS: 24.0000 | ORAL_TABLET | Freq: Once | ORAL | 0 refills | Status: AC

## 2022-11-12 NOTE — H&P (Signed)
Carla Ali; [id number]; 12-19-1960   HPI Patient is a 62 year old white female who was referred to my care by Tomma Rakers for a follow-up screening colonoscopy.  She last had a colonoscopy in 2019 which was negative.  She does have an immediate family history of colon cancer and needs a colonoscopy every 5 years.  She denies any abdominal pain, abnormal diarrhea or constipation, weight change, or abnormal blood in her stools. 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     levothyroxine (SYNTHROID, LEVOTHROID) 50 MCG tablet Take 50 mcg by mouth daily before breakfast.     lisinopril-hydrochlorothiazide (ZESTORETIC) 10-12.5 MG tablet Take 1 tablet by mouth in the morning.     methocarbamol (ROBAXIN) 500 MG tablet Take 1 tablet (500 mg total) by mouth every 6 (six) hours as needed for muscle spasms. 40 tablet 0   oxyCODONE (ROXICODONE) 15 MG immediate release tablet Take 15 mg by mouth in the morning, at noon, in the evening, and at bedtime.     potassium chloride (K-DUR) 10 MEQ tablet Take 10 mEq by mouth 2 (two) times  daily.     venlafaxine (EFFEXOR) 37.5 MG tablet Take 37.5 mg by mouth 2 (two) times daily.     No current facility-administered medications on file prior to visit.    Allergies  Allergen Reactions   Irbesartan Hives    Social History   Substance and Sexual Activity  Alcohol Use Not Currently    Social History   Tobacco Use  Smoking Status Former   Current packs/day: 0.00   Average packs/day: 0.5 packs/day for 15.0 years (7.5 ttl pk-yrs)   Types: Cigarettes   Start date: 12/19/1989   Quit date: 12/19/2004   Years since quitting: 17.9  Smokeless Tobacco Never    Review of Systems  Constitutional: Negative.   HENT: Negative.    Eyes: Negative.   Respiratory: Negative.    Cardiovascular: Negative.   Gastrointestinal: Negative.   Genitourinary: Negative.   Musculoskeletal: Negative.   Skin: Negative.   Neurological: Negative.   Endo/Heme/Allergies: Negative.   Psychiatric/Behavioral: Negative.      Objective   Vitals:   11/12/22 1452  BP: (!) 145/82  Pulse: 62  Resp: 16  Temp: 97.9 F (36.6 C)  SpO2: 98%    Physical Exam Vitals reviewed.  Constitutional:      Appearance: Normal appearance. She is not ill-appearing.  HENT:     Head: Normocephalic.  Cardiovascular:  Rate and Rhythm: Normal rate and regular rhythm.     Heart sounds: Normal heart sounds. No murmur heard.    No friction rub. No gallop.  Pulmonary:     Effort: Pulmonary effort is normal. No respiratory distress.     Breath sounds: Normal breath sounds. No stridor. No wheezing, rhonchi or rales.  Abdominal:     General: Bowel sounds are normal. There is no distension.     Palpations: Abdomen is soft. There is no mass.     Tenderness: There is no abdominal tenderness. There is no guarding or rebound.     Hernia: No hernia is present.  Skin:    General: Skin is warm and dry.  Neurological:     Mental Status: She is alert and oriented to person, place, and time.   Previous  colonoscopy report reviewed  Assessment  Immediate family history of colon cancer, need for screening colonoscopy Plan  Patient is scheduled for a screening colonoscopy on 12/03/2022.  The risks and benefits of the procedure including bleeding and perforation were fully explained to the patient, who gave informed consent.  Sutabs have been prescribed for bowel preparation.

## 2022-11-12 NOTE — Progress Notes (Signed)
Carla Ali; [id number]; 12-19-1960   HPI Patient is a 62 year old white female who was referred to my care by Tomma Rakers for a follow-up screening colonoscopy.  She last had a colonoscopy in 2019 which was negative.  She does have an immediate family history of colon cancer and needs a colonoscopy every 5 years.  She denies any abdominal pain, abnormal diarrhea or constipation, weight change, or abnormal blood in her stools. 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     levothyroxine (SYNTHROID, LEVOTHROID) 50 MCG tablet Take 50 mcg by mouth daily before breakfast.     lisinopril-hydrochlorothiazide (ZESTORETIC) 10-12.5 MG tablet Take 1 tablet by mouth in the morning.     methocarbamol (ROBAXIN) 500 MG tablet Take 1 tablet (500 mg total) by mouth every 6 (six) hours as needed for muscle spasms. 40 tablet 0   oxyCODONE (ROXICODONE) 15 MG immediate release tablet Take 15 mg by mouth in the morning, at noon, in the evening, and at bedtime.     potassium chloride (K-DUR) 10 MEQ tablet Take 10 mEq by mouth 2 (two) times  daily.     venlafaxine (EFFEXOR) 37.5 MG tablet Take 37.5 mg by mouth 2 (two) times daily.     No current facility-administered medications on file prior to visit.    Allergies  Allergen Reactions   Irbesartan Hives    Social History   Substance and Sexual Activity  Alcohol Use Not Currently    Social History   Tobacco Use  Smoking Status Former   Current packs/day: 0.00   Average packs/day: 0.5 packs/day for 15.0 years (7.5 ttl pk-yrs)   Types: Cigarettes   Start date: 12/19/1989   Quit date: 12/19/2004   Years since quitting: 17.9  Smokeless Tobacco Never    Review of Systems  Constitutional: Negative.   HENT: Negative.    Eyes: Negative.   Respiratory: Negative.    Cardiovascular: Negative.   Gastrointestinal: Negative.   Genitourinary: Negative.   Musculoskeletal: Negative.   Skin: Negative.   Neurological: Negative.   Endo/Heme/Allergies: Negative.   Psychiatric/Behavioral: Negative.      Objective   Vitals:   11/12/22 1452  BP: (!) 145/82  Pulse: 62  Resp: 16  Temp: 97.9 F (36.6 C)  SpO2: 98%    Physical Exam Vitals reviewed.  Constitutional:      Appearance: Normal appearance. She is not ill-appearing.  HENT:     Head: Normocephalic.  Cardiovascular:  Rate and Rhythm: Normal rate and regular rhythm.     Heart sounds: Normal heart sounds. No murmur heard.    No friction rub. No gallop.  Pulmonary:     Effort: Pulmonary effort is normal. No respiratory distress.     Breath sounds: Normal breath sounds. No stridor. No wheezing, rhonchi or rales.  Abdominal:     General: Bowel sounds are normal. There is no distension.     Palpations: Abdomen is soft. There is no mass.     Tenderness: There is no abdominal tenderness. There is no guarding or rebound.     Hernia: No hernia is present.  Skin:    General: Skin is warm and dry.  Neurological:     Mental Status: She is alert and oriented to person, place, and time.   Previous  colonoscopy report reviewed  Assessment  Immediate family history of colon cancer, need for screening colonoscopy Plan  Patient is scheduled for a screening colonoscopy on 12/03/2022.  The risks and benefits of the procedure including bleeding and perforation were fully explained to the patient, who gave informed consent.  Sutabs have been prescribed for bowel preparation.

## 2022-11-22 ENCOUNTER — Ambulatory Visit: Payer: 59

## 2022-12-03 ENCOUNTER — Ambulatory Visit (HOSPITAL_COMMUNITY): Payer: 59 | Admitting: Certified Registered"

## 2022-12-03 ENCOUNTER — Encounter (HOSPITAL_COMMUNITY): Payer: Self-pay | Admitting: General Surgery

## 2022-12-03 ENCOUNTER — Ambulatory Visit (HOSPITAL_COMMUNITY)
Admission: RE | Admit: 2022-12-03 | Discharge: 2022-12-03 | Disposition: A | Discharge status: Home / Self Care | Payer: 59 | Attending: General Surgery | Admitting: General Surgery

## 2022-12-03 ENCOUNTER — Encounter (HOSPITAL_COMMUNITY)
Admission: RE | Disposition: A | Discharge status: Home / Self Care | Payer: Self-pay | Source: Home / Self Care | Attending: General Surgery

## 2022-12-03 ENCOUNTER — Other Ambulatory Visit: Payer: Self-pay

## 2022-12-03 DIAGNOSIS — Z800 Family history of malignant neoplasm of digestive organs: Secondary | ICD-10-CM

## 2022-12-03 DIAGNOSIS — Z1211 Encounter for screening for malignant neoplasm of colon: Secondary | ICD-10-CM | POA: Diagnosis not present

## 2022-12-03 DIAGNOSIS — I10 Essential (primary) hypertension: Secondary | ICD-10-CM | POA: Diagnosis not present

## 2022-12-03 DIAGNOSIS — E039 Hypothyroidism, unspecified: Secondary | ICD-10-CM | POA: Diagnosis not present

## 2022-12-03 DIAGNOSIS — Z87891 Personal history of nicotine dependence: Secondary | ICD-10-CM | POA: Diagnosis not present

## 2022-12-03 DIAGNOSIS — F419 Anxiety disorder, unspecified: Secondary | ICD-10-CM | POA: Insufficient documentation

## 2022-12-03 DIAGNOSIS — F32A Depression, unspecified: Secondary | ICD-10-CM | POA: Diagnosis not present

## 2022-12-03 HISTORY — PX: COLONOSCOPY WITH PROPOFOL: SHX5780

## 2022-12-03 SURGERY — COLONOSCOPY WITH PROPOFOL
Anesthesia: General

## 2022-12-03 MED ORDER — SODIUM CHLORIDE 0.9% FLUSH: 10.0000 mL | Freq: Two times a day (BID) | INTRAVENOUS | Status: DC

## 2022-12-03 MED ORDER — PROPOFOL 500 MG/50ML IV EMUL
INTRAVENOUS | Status: DC | PRN
  Administered 2022-12-03: 150 ug/kg/min via INTRAVENOUS

## 2022-12-03 MED ORDER — PROPOFOL 10 MG/ML IV BOLUS
INTRAVENOUS | Status: DC | PRN
  Administered 2022-12-03: 80 mg via INTRAVENOUS
  Administered 2022-12-03: 30 mg via INTRAVENOUS
  Administered 2022-12-03: 20 mg via INTRAVENOUS
  Administered 2022-12-03: 30 mg via INTRAVENOUS

## 2022-12-03 MED ORDER — LACTATED RINGERS IV SOLN: INTRAVENOUS | Status: DC | PRN

## 2022-12-03 MED ORDER — LIDOCAINE HCL (CARDIAC) PF 100 MG/5ML IV SOSY
PREFILLED_SYRINGE | INTRAVENOUS | Status: DC | PRN
  Administered 2022-12-03: 80 mg via INTRAVENOUS

## 2022-12-03 MED ORDER — LIDOCAINE HCL (PF) 2 % IJ SOLN
INTRAMUSCULAR | Status: AC
  Filled 2022-12-03: qty 10

## 2022-12-03 MED ORDER — PROPOFOL 1000 MG/100ML IV EMUL
INTRAVENOUS | Status: AC
  Filled 2022-12-03: qty 100

## 2022-12-03 MED ORDER — PHENYLEPHRINE 80 MCG/ML (10ML) SYRINGE FOR IV PUSH (FOR BLOOD PRESSURE SUPPORT)
PREFILLED_SYRINGE | INTRAVENOUS | Status: AC
  Filled 2022-12-03: qty 10

## 2022-12-03 NOTE — Anesthesia Preprocedure Evaluation (Signed)
Anesthesia Evaluation  Patient identified by MRN, date of birth, ID band Patient awake    Reviewed: Allergy & Precautions, H&P , NPO status , Patient's Chart, lab work & pertinent test results, reviewed documented beta blocker date and time   Airway Mallampati: II  TM Distance: >3 FB Neck ROM: full    Dental no notable dental hx.    Pulmonary neg pulmonary ROS, former smoker   Pulmonary exam normal breath sounds clear to auscultation       Cardiovascular Exercise Tolerance: Good hypertension, negative cardio ROS + Valvular Problems/Murmurs  Rhythm:regular Rate:Normal     Neuro/Psych  PSYCHIATRIC DISORDERS Anxiety Depression    negative neurological ROS  negative psych ROS   GI/Hepatic negative GI ROS, Neg liver ROS,,,  Endo/Other  negative endocrine ROSHypothyroidism    Renal/GU negative Renal ROS  negative genitourinary   Musculoskeletal   Abdominal   Peds  Hematology negative hematology ROS (+)   Anesthesia Other Findings   Reproductive/Obstetrics negative OB ROS                             Anesthesia Physical Anesthesia Plan  ASA: 2  Anesthesia Plan: General   Post-op Pain Management:    Induction:   PONV Risk Score and Plan: Propofol infusion  Airway Management Planned:   Additional Equipment:   Intra-op Plan:   Post-operative Plan:   Informed Consent: I have reviewed the patients History and Physical, chart, labs and discussed the procedure including the risks, benefits and alternatives for the proposed anesthesia with the patient or authorized representative who has indicated his/her understanding and acceptance.     Dental Advisory Given  Plan Discussed with: CRNA  Anesthesia Plan Comments:        Anesthesia Quick Evaluation

## 2022-12-03 NOTE — Interval H&P Note (Signed)
History and Physical Interval Note:  12/03/2022 7:22 AM  Carla Ali  has presented today for surgery, with the diagnosis of SCREENING FOR COLON CANCER.  The various methods of treatment have been discussed with the patient and family. After consideration of risks, benefits and other options for treatment, the patient has consented to  Procedure(s): COLONOSCOPY WITH PROPOFOL (N/A) as a surgical intervention.  The patient's history has been reviewed, patient examined, no change in status, stable for surgery.  I have reviewed the patient's chart and labs.  Questions were answered to the patient's satisfaction.     Franky Macho

## 2022-12-03 NOTE — Op Note (Signed)
[facility] Patient Name: Carla Ali Procedure Date: 12/03/2022 7:15 AM MRN: [id number] Date of Birth: 09/25/1960 Attending MD: Franky Macho , MD, [id number] CSN: [id number] Age: 62 Admit Type: Outpatient Procedure:                Colonoscopy Indications:              Screening in patient at increased risk: Family                            history of 1st-degree relative with colorectal                            cancer Providers:                Franky Macho, MD, Buel Ream. Thomasena Edis RN, RN,                            Zena Amos Referring MD:              Medicines:                Propofol per Anesthesia Complications:            No immediate complications. Estimated Blood Loss:     Estimated blood loss: none. Procedure:                Pre-Anesthesia Assessment:                           - Prior to the procedure, a History and Physical                            was performed, and patient medications and                            allergies were reviewed. The patient is competent.                            The risks and benefits of the procedure and the                            sedation options and risks were discussed with the                            patient. All questions were answered and informed                            consent was obtained. Patient identification and                            proposed procedure were verified by the physician,                            the nurse, the anesthesiologist, the anesthetist                            and the technician in  the procedure room. Mental                            Status Examination: alert and oriented. Airway                            Examination: normal oropharyngeal airway and neck                            mobility. Respiratory Examination: clear to                            auscultation. CV Examination: RRR, no murmurs, no                            S3 or S4. Prophylactic Antibiotics: The  patient                            does not require prophylactic antibiotics. Prior                            Anticoagulants: The patient has taken no                            anticoagulant or antiplatelet agents. ASA Grade                            Assessment: II - A patient with mild systemic                            disease. After reviewing the risks and benefits,                            the patient was deemed in satisfactory condition to                            undergo the procedure. The anesthesia plan was to                            use deep sedation / analgesia. Immediately prior to                            administration of medications, the patient was                            re-assessed for adequacy to receive sedatives. The                            heart rate, respiratory rate, oxygen saturations,                            blood pressure, adequacy of pulmonary ventilation,  and response to care were monitored throughout the                            procedure. The physical status of the patient was                            re-assessed after the procedure.                           After obtaining informed consent, the colonoscope                            was passed under direct vision. Throughout the                            procedure, the patient's blood pressure, pulse, and                            oxygen saturations were monitored continuously. The                            [id number]) scope was introduced through the                            anus and advanced to the the cecum, identified by                            the appendiceal orifice, ileocecal valve and                            palpation. No anatomical landmarks were                            photographed. The colonoscopy was performed without                            difficulty. The patient tolerated the procedure                            well.  The quality of the bowel preparation was                            adequate. The total duration of the procedure was 7                            minutes. Scope In: 7:32:22 AM Scope Out: 7:47:22 AM Scope Withdrawal Time: 0 hours 7 minutes 30 seconds  Total Procedure Duration: 0 hours 15 minutes 0 seconds  Findings:      The perianal and digital rectal examinations were normal. [Pertinent       Negatives].      The entire examined colon appeared normal on direct and retroflexion       views. Impression:               - The entire examined colon is  normal on direct and                            retroflexion views.                           - No specimens collected. Moderate Sedation:      Moderate (conscious) sedation was administered by the nurse and       supervised by the endoscopist. The patient's oxygen saturation, heart       rate, blood pressure and response to care were monitored. Recommendation:           - Written discharge instructions were provided to                            the patient.                           - The signs and symptoms of potential delayed                            complications were discussed with the patient.                           - Patient has a contact number available for                            emergencies.                           - Return to normal activities tomorrow.                           - Resume previous diet.                           - Continue present medications.                           - Repeat colonoscopy in 5 years for screening                            purposes. Procedure Code(s):        --- Professional ---                           [id number], Colonoscopy, flexible; diagnostic, including                            collection of specimen(s) by brushing or washing,                            when performed (separate procedure) Diagnosis Code(s):        --- Professional ---                           Z80.0, Family  history of malignant neoplasm of  digestive organs                           Z12.11, Encounter for screening for malignant                            neoplasm of colon CPT copyright 2022 [employer]. All rights reserved. The codes documented in this report are preliminary and upon coder review may  be revised to meet current compliance requirements. Franky Macho, MD Franky Macho, MD 12/03/2022 7:52:22 AM This report has been signed electronically. Number of Addenda: 0

## 2022-12-03 NOTE — Anesthesia Postprocedure Evaluation (Signed)
Anesthesia Post Note  Patient: Carla Ali  Procedure(s) Performed: COLONOSCOPY WITH PROPOFOL  Patient location during evaluation: Phase II Anesthesia Type: General Level of consciousness: awake Pain management: pain level controlled Vital Signs Assessment: post-procedure vital signs reviewed and stable Respiratory status: spontaneous breathing and respiratory function stable Cardiovascular status: blood pressure returned to baseline and stable Postop Assessment: no headache and no apparent nausea or vomiting Anesthetic complications: no Comments: Late entry   No notable events documented.   Last Vitals:  Vitals:   12/03/22 0646 12/03/22 0751  BP: (!) 140/71 (!) 90/52  Pulse: 60 62  Resp: 10 16  Temp: 36.7 C 36.7 C  SpO2: 100% 98%    Last Pain:  Vitals:   12/03/22 0751  TempSrc: Oral  PainSc: 0-No pain                 Windell Norfolk

## 2022-12-03 NOTE — Transfer of Care (Addendum)
Immediate Anesthesia Transfer of Care Note  Patient: Carla Ali  Procedure(s) Performed: COLONOSCOPY WITH PROPOFOL  Patient Location: PACU and Endoscopy Unit  Anesthesia Type:General  Level of Consciousness: drowsy and patient cooperative  Airway & Oxygen Therapy: Patient Spontanous Breathing and Patient connected to nasal cannula oxygen  Post-op Assessment: Report given to RN and Post -op Vital signs reviewed and stable  Post vital signs: Reviewed and stable  Last Vitals:  Vitals Value Taken Time  BP 90/52 12/03/22   0751  Temp 36.7 12/03/22   0751  Pulse 62 12/03/22   0751  Resp 16 12/03/22   0751  SpO2 98% 12/03/22   0751    Last Pain:  Vitals:   12/03/22 0728  TempSrc:   PainSc: 0-No pain      Patients Stated Pain Goal: 6 (12/03/22 0636)  Complications: No notable events documented.

## 2022-12-09 ENCOUNTER — Encounter (HOSPITAL_COMMUNITY): Payer: Self-pay | Admitting: General Surgery

## 2022-12-19 ENCOUNTER — Ambulatory Visit
Admission: RE | Admit: 2022-12-19 | Discharge: 2022-12-19 | Disposition: A | Discharge status: Home / Self Care | Payer: 59 | Source: Ambulatory Visit | Attending: Family | Admitting: Family

## 2022-12-19 DIAGNOSIS — Z1231 Encounter for screening mammogram for malignant neoplasm of breast: Secondary | ICD-10-CM
